# Patient Record
Sex: Female | Born: 1960 | Race: White | Hispanic: No | Marital: Single | Smoking: Former smoker
Health system: Southern US, Community
[De-identification: ages and names within clinical notes are randomized; demographics above are authoritative.]

## PROBLEM LIST (undated history)

## (undated) DIAGNOSIS — M1990 Unspecified osteoarthritis, unspecified site: Secondary | ICD-10-CM

## (undated) DIAGNOSIS — I739 Peripheral vascular disease, unspecified: Secondary | ICD-10-CM

## (undated) DIAGNOSIS — M79604 Pain in right leg: Secondary | ICD-10-CM

## (undated) DIAGNOSIS — M79605 Pain in left leg: Principal | ICD-10-CM

## (undated) DIAGNOSIS — I10 Essential (primary) hypertension: Secondary | ICD-10-CM

## (undated) DIAGNOSIS — I209 Angina pectoris, unspecified: Secondary | ICD-10-CM

## (undated) HISTORY — DX: Peripheral vascular disease, unspecified: I73.9

## (undated) HISTORY — PX: OTHER SURGICAL HISTORY: SHX169

## (undated) HISTORY — DX: Pain in left leg: M79.605

## (undated) HISTORY — DX: Pain in right leg: M79.604

---

## 2004-04-07 ENCOUNTER — Other Ambulatory Visit: Admission: RE | Admit: 2004-04-07 | Discharge: 2004-04-07 | Payer: Self-pay | Admitting: Obstetrics and Gynecology

## 2006-03-08 ENCOUNTER — Other Ambulatory Visit: Admission: RE | Admit: 2006-03-08 | Discharge: 2006-03-08 | Payer: Self-pay | Admitting: Nephrology

## 2006-04-04 ENCOUNTER — Encounter: Payer: Self-pay | Admitting: Vascular Surgery

## 2006-04-04 ENCOUNTER — Ambulatory Visit (HOSPITAL_COMMUNITY): Admission: RE | Admit: 2006-04-04 | Discharge: 2006-04-04 | Payer: Self-pay | Admitting: Nephrology

## 2006-06-19 HISTORY — PX: ARTHROSCOPY KNEE W/ DRILLING: SUR92

## 2006-06-25 ENCOUNTER — Encounter: Admission: RE | Admit: 2006-06-25 | Discharge: 2006-06-25 | Payer: Self-pay | Admitting: Nephrology

## 2006-07-31 ENCOUNTER — Inpatient Hospital Stay (HOSPITAL_COMMUNITY): Admission: AD | Admit: 2006-07-31 | Discharge: 2006-07-31 | Payer: Self-pay | Admitting: Obstetrics and Gynecology

## 2006-08-02 ENCOUNTER — Ambulatory Visit (HOSPITAL_COMMUNITY): Admission: RE | Admit: 2006-08-02 | Discharge: 2006-08-02 | Payer: Self-pay | Admitting: Family Medicine

## 2006-08-09 ENCOUNTER — Encounter: Admission: RE | Admit: 2006-08-09 | Discharge: 2006-09-25 | Payer: Self-pay | Admitting: Orthopedic Surgery

## 2006-08-15 ENCOUNTER — Encounter: Admission: RE | Admit: 2006-08-15 | Discharge: 2006-08-15 | Payer: Self-pay | Admitting: Rheumatology

## 2006-08-16 ENCOUNTER — Ambulatory Visit: Payer: Self-pay | Admitting: Obstetrics & Gynecology

## 2006-09-20 ENCOUNTER — Ambulatory Visit (HOSPITAL_COMMUNITY): Admission: RE | Admit: 2006-09-20 | Discharge: 2006-09-20 | Payer: Self-pay | Admitting: Obstetrics

## 2006-09-27 ENCOUNTER — Encounter: Admission: RE | Admit: 2006-09-27 | Discharge: 2006-11-09 | Payer: Self-pay | Admitting: Orthopedic Surgery

## 2006-09-28 ENCOUNTER — Encounter: Admission: RE | Admit: 2006-09-28 | Discharge: 2006-09-28 | Payer: Self-pay | Admitting: Obstetrics

## 2006-10-26 ENCOUNTER — Ambulatory Visit (HOSPITAL_COMMUNITY): Admission: RE | Admit: 2006-10-26 | Discharge: 2006-10-26 | Payer: Self-pay | Admitting: Obstetrics

## 2007-01-12 ENCOUNTER — Inpatient Hospital Stay (HOSPITAL_COMMUNITY): Admission: EM | Admit: 2007-01-12 | Discharge: 2007-01-13 | Payer: Self-pay | Admitting: Emergency Medicine

## 2007-01-12 ENCOUNTER — Encounter: Payer: Self-pay | Admitting: Gastroenterology

## 2007-01-12 DIAGNOSIS — K2990 Gastroduodenitis, unspecified, without bleeding: Secondary | ICD-10-CM

## 2007-01-12 DIAGNOSIS — K2970 Gastritis, unspecified, without bleeding: Secondary | ICD-10-CM | POA: Insufficient documentation

## 2007-01-13 ENCOUNTER — Encounter: Payer: Self-pay | Admitting: Gastroenterology

## 2007-01-13 DIAGNOSIS — K648 Other hemorrhoids: Secondary | ICD-10-CM | POA: Insufficient documentation

## 2007-01-16 ENCOUNTER — Ambulatory Visit: Payer: Self-pay | Admitting: Gastroenterology

## 2007-01-21 ENCOUNTER — Ambulatory Visit: Payer: Self-pay | Admitting: Gastroenterology

## 2007-01-30 ENCOUNTER — Ambulatory Visit: Payer: Self-pay | Admitting: Gastroenterology

## 2007-01-30 LAB — CONVERTED CEMR LAB
Basophils Absolute: 0 10*3/uL (ref 0.0–0.1)
Eosinophils Absolute: 0.1 10*3/uL (ref 0.0–0.6)
Eosinophils Relative: 0.7 % (ref 0.0–5.0)
HCT: 31.7 % — ABNORMAL LOW (ref 36.0–46.0)
Hemoglobin: 10.2 g/dL — ABNORMAL LOW (ref 12.0–15.0)
Lymphocytes Relative: 8.8 % — ABNORMAL LOW (ref 12.0–46.0)
Monocytes Absolute: 0.6 10*3/uL (ref 0.2–0.7)
Monocytes Relative: 5.2 % (ref 3.0–11.0)
Platelets: 460 10*3/uL — ABNORMAL HIGH (ref 150–400)
RDW: 19.3 % — ABNORMAL HIGH (ref 11.5–14.6)
WBC: 10.9 10*3/uL — ABNORMAL HIGH (ref 4.5–10.5)

## 2007-02-06 ENCOUNTER — Ambulatory Visit: Payer: Self-pay | Admitting: Gastroenterology

## 2007-02-06 LAB — CONVERTED CEMR LAB
Fecal Occult Blood: NEGATIVE
OCCULT 5: NEGATIVE

## 2007-02-12 ENCOUNTER — Ambulatory Visit (HOSPITAL_COMMUNITY): Admission: RE | Admit: 2007-02-12 | Discharge: 2007-02-12 | Payer: Self-pay | Admitting: Gastroenterology

## 2007-02-14 ENCOUNTER — Ambulatory Visit (HOSPITAL_COMMUNITY): Admission: RE | Admit: 2007-02-14 | Discharge: 2007-02-14 | Payer: Self-pay | Admitting: Gastroenterology

## 2007-02-14 DIAGNOSIS — D259 Leiomyoma of uterus, unspecified: Secondary | ICD-10-CM | POA: Insufficient documentation

## 2007-02-20 ENCOUNTER — Ambulatory Visit: Payer: Self-pay | Admitting: Gastroenterology

## 2007-02-27 ENCOUNTER — Ambulatory Visit (HOSPITAL_COMMUNITY): Admission: RE | Admit: 2007-02-27 | Discharge: 2007-02-27 | Payer: Self-pay | Admitting: Gastroenterology

## 2007-03-25 ENCOUNTER — Ambulatory Visit: Payer: Self-pay | Admitting: Gastroenterology

## 2007-03-25 LAB — CONVERTED CEMR LAB
Eosinophils Absolute: 0 10*3/uL (ref 0.0–0.6)
Eosinophils Relative: 0.3 % (ref 0.0–5.0)
Platelets: 531 10*3/uL — ABNORMAL HIGH (ref 150–400)
RBC: 4 M/uL (ref 3.87–5.11)

## 2007-03-27 ENCOUNTER — Ambulatory Visit: Payer: Self-pay | Admitting: Hematology and Oncology

## 2007-04-05 LAB — CBC & DIFF AND RETIC
BASO%: 0.3 % (ref 0.0–2.0)
EOS%: 0.1 % (ref 0.0–7.0)
HCT: 28.9 % — ABNORMAL LOW (ref 34.8–46.6)
HGB: 9.3 g/dL — ABNORMAL LOW (ref 11.6–15.9)
IRF: 0.33 (ref 0.130–0.330)
MCH: 22.7 pg — ABNORMAL LOW (ref 26.0–34.0)
MCHC: 32.4 g/dL (ref 32.0–36.0)
MONO#: 0.4 10*3/uL (ref 0.1–0.9)
RDW: 17.9 % — ABNORMAL HIGH (ref 11.3–14.5)
RETIC #: 101.1 10*3/uL (ref 19.7–115.1)
WBC: 9.1 10*3/uL (ref 3.9–10.0)
lymph#: 0.7 10*3/uL — ABNORMAL LOW (ref 0.9–3.3)

## 2007-04-05 LAB — URINALYSIS, MICROSCOPIC - CHCC
Bilirubin (Urine): NEGATIVE
Ketones: NEGATIVE mg/dL
Protein: 30 mg/dL
Specific Gravity, Urine: 1.03 (ref 1.003–1.035)
pH: 5 (ref 4.6–8.0)

## 2007-04-09 LAB — PROTEIN ELECTROPHORESIS, SERUM
Alpha-2-Globulin: 13.8 % — ABNORMAL HIGH (ref 7.1–11.8)
Beta 2: 6.6 % — ABNORMAL HIGH (ref 3.2–6.5)
Beta Globulin: 7.1 % (ref 4.7–7.2)
Gamma Globulin: 22 % — ABNORMAL HIGH (ref 11.1–18.8)
Total Protein, Serum Electrophoresis: 7.2 g/dL (ref 6.0–8.3)

## 2007-04-09 LAB — FOLATE: Folate: 16 ng/mL

## 2007-04-09 LAB — FERRITIN: Ferritin: 44 ng/mL (ref 10–291)

## 2007-04-09 LAB — COMPREHENSIVE METABOLIC PANEL
ALT: <8 U/L (ref 0–35)
AST: 8 U/L (ref 0–37)
Alkaline Phosphatase: 52 U/L (ref 39–117)
Creatinine, Ser: 0.5 mg/dL (ref 0.40–1.20)
Sodium: 140 mEq/L (ref 135–145)
Total Bilirubin: 0.4 mg/dL (ref 0.3–1.2)
Total Protein: 7.2 g/dL (ref 6.0–8.3)

## 2007-04-09 LAB — IRON AND TIBC: %SAT: 6 % — ABNORMAL LOW (ref 20–55)

## 2007-04-09 LAB — ERYTHROPOIETIN: Erythropoietin: 77 m[IU]/mL — ABNORMAL HIGH (ref 2.6–34.0)

## 2007-04-09 LAB — VITAMIN B12: Vitamin B-12: 403 pg/mL (ref 211–911)

## 2007-04-12 ENCOUNTER — Other Ambulatory Visit: Admission: RE | Admit: 2007-04-12 | Discharge: 2007-04-12 | Payer: Self-pay | Admitting: Hematology and Oncology

## 2007-04-12 ENCOUNTER — Encounter: Payer: Self-pay | Admitting: Hematology and Oncology

## 2007-04-15 ENCOUNTER — Emergency Department (HOSPITAL_COMMUNITY): Admission: EM | Admit: 2007-04-15 | Discharge: 2007-04-15 | Payer: Self-pay | Admitting: Emergency Medicine

## 2007-04-15 LAB — ENDOMYSIAL IGA ANTIBODY: Endomysial IgA Autoabs: <1:10 {titer}

## 2007-04-16 ENCOUNTER — Encounter (HOSPITAL_COMMUNITY): Admission: RE | Admit: 2007-04-16 | Discharge: 2007-06-19 | Payer: Self-pay | Admitting: Hematology and Oncology

## 2007-04-18 LAB — TYPE & CROSSMATCH - CHCC

## 2007-05-23 ENCOUNTER — Ambulatory Visit: Payer: Self-pay | Admitting: Hematology and Oncology

## 2007-05-23 LAB — CBC WITH DIFFERENTIAL/PLATELET
Basophils Absolute: 0 10*3/uL (ref 0.0–0.1)
Eosinophils Absolute: 0 10*3/uL (ref 0.0–0.5)
HGB: 9 g/dL — ABNORMAL LOW (ref 11.6–15.9)
MCV: 75.2 fL — ABNORMAL LOW (ref 81.0–101.0)
MONO%: 3.1 % (ref 0.0–13.0)
NEUT#: 5.8 10*3/uL (ref 1.5–6.5)
RBC: 3.78 10*6/uL (ref 3.70–5.32)
RDW: 14.6 % — ABNORMAL HIGH (ref 11.3–14.5)
WBC: 7.2 10*3/uL (ref 3.9–10.0)
lymph#: 1.2 10*3/uL (ref 0.9–3.3)

## 2007-05-23 LAB — BASIC METABOLIC PANEL
BUN: 13 mg/dL (ref 6–23)
Chloride: 102 mEq/L (ref 96–112)
Glucose, Bld: 142 mg/dL — ABNORMAL HIGH (ref 70–99)
Potassium: 3.5 mEq/L (ref 3.5–5.3)
Sodium: 136 mEq/L (ref 135–145)

## 2007-05-23 LAB — IRON AND TIBC
%SAT: 5 % — ABNORMAL LOW (ref 20–55)
Iron: 12 ug/dL — ABNORMAL LOW (ref 42–145)
UIBC: 210 ug/dL

## 2007-05-23 LAB — FERRITIN: Ferritin: 334 ng/mL — ABNORMAL HIGH (ref 10–291)

## 2007-06-05 LAB — CBC WITH DIFFERENTIAL/PLATELET
BASO%: 0.4 % (ref 0.0–2.0)
EOS%: 0.2 % (ref 0.0–7.0)
HGB: 9.3 g/dL — ABNORMAL LOW (ref 11.6–15.9)
MCH: 23.5 pg — ABNORMAL LOW (ref 26.0–34.0)
MCHC: 32.1 g/dL (ref 32.0–36.0)
MONO#: 0.5 10*3/uL (ref 0.1–0.9)
RDW: 18.2 % — ABNORMAL HIGH (ref 11.3–14.5)
WBC: 9.3 10*3/uL (ref 3.9–10.0)
lymph#: 0.8 10*3/uL — ABNORMAL LOW (ref 0.9–3.3)

## 2007-08-29 ENCOUNTER — Ambulatory Visit: Payer: Self-pay | Admitting: Hematology and Oncology

## 2007-08-29 DIAGNOSIS — D509 Iron deficiency anemia, unspecified: Secondary | ICD-10-CM

## 2007-08-29 DIAGNOSIS — Z8619 Personal history of other infectious and parasitic diseases: Secondary | ICD-10-CM

## 2007-08-29 DIAGNOSIS — M069 Rheumatoid arthritis, unspecified: Secondary | ICD-10-CM | POA: Insufficient documentation

## 2007-08-29 DIAGNOSIS — R195 Other fecal abnormalities: Secondary | ICD-10-CM

## 2007-08-29 DIAGNOSIS — N920 Excessive and frequent menstruation with regular cycle: Secondary | ICD-10-CM

## 2007-09-04 LAB — BASIC METABOLIC PANEL
BUN: 13 mg/dL (ref 6–23)
CO2: 23 mEq/L (ref 19–32)
Calcium: 8.4 mg/dL (ref 8.4–10.5)
Creatinine, Ser: 0.46 mg/dL (ref 0.40–1.20)
Glucose, Bld: 127 mg/dL — ABNORMAL HIGH (ref 70–99)
Sodium: 139 mEq/L (ref 135–145)

## 2007-09-04 LAB — CBC WITH DIFFERENTIAL/PLATELET
Eosinophils Absolute: 0 10*3/uL (ref 0.0–0.5)
HCT: 33.2 % — ABNORMAL LOW (ref 34.8–46.6)
LYMPH%: 8.7 % — ABNORMAL LOW (ref 14.0–48.0)
MCV: 74.7 fL — ABNORMAL LOW (ref 81.0–101.0)
MONO%: 4.4 % (ref 0.0–13.0)
NEUT#: 8 10*3/uL — ABNORMAL HIGH (ref 1.5–6.5)
NEUT%: 86.2 % — ABNORMAL HIGH (ref 39.6–76.8)
Platelets: 329 10*3/uL (ref 145–400)
RBC: 4.44 10*6/uL (ref 3.70–5.32)

## 2007-09-04 LAB — IRON AND TIBC
%SAT: 15 % — ABNORMAL LOW (ref 20–55)
Iron: 28 ug/dL — ABNORMAL LOW (ref 42–145)

## 2007-10-03 ENCOUNTER — Ambulatory Visit (HOSPITAL_COMMUNITY): Admission: RE | Admit: 2007-10-03 | Discharge: 2007-10-03 | Payer: Self-pay | Admitting: Nephrology

## 2007-11-28 ENCOUNTER — Ambulatory Visit: Payer: Self-pay | Admitting: Hematology and Oncology

## 2007-12-12 ENCOUNTER — Encounter: Payer: Self-pay | Admitting: Gastroenterology

## 2008-04-03 IMAGING — NM NM BOWEL IMG MECKELS
1 series · 6 of 6 positions shown · non-contrast
Comparison: None.

CLINICAL DATA: GI bleeding and anemia

NM BOWEL (MECKELS) SCAN
TECHNIQUE: Sequential abdominal images were obtained following intravenous
injection of radiopharmaceutical.
Radiopharmaceutical:  10.8 mCi 9c-RRm pertechnetate

[Series 1: dy flow · 4.75mm/px · 6 of 60 frames shown]
[frame 6/60]
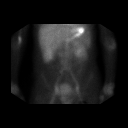
[frame 16/60]
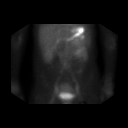
[frame 26/60]
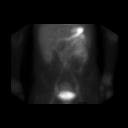
[frame 36/60]
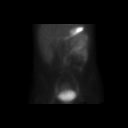
[frame 46/60]
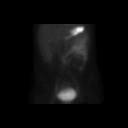
[frame 56/60]
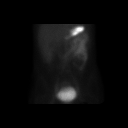

[6 of 6 positions shown; findings below may reference images not displayed]

FINDINGS: Imaging was carried [DATE] minutes. Normal activity of the gastric
mucosa is evident. There is no evidence for appearance or accumulation of
activity in the right lower quadrant or elsewhere in the lower abdomen and
anatomic pelvis to suggest ectopic gastric mucosa in a Meckel's diverticulum.

IMPRESSION

No scintigraphic evidence for a Meckel's diverticulum.

## 2008-05-11 ENCOUNTER — Encounter: Admission: RE | Admit: 2008-05-11 | Discharge: 2008-05-11 | Payer: Self-pay | Admitting: Nephrology

## 2008-05-15 ENCOUNTER — Ambulatory Visit: Payer: Self-pay | Admitting: Hematology and Oncology

## 2008-05-20 LAB — BASIC METABOLIC PANEL
BUN: 15 mg/dL (ref 6–23)
Potassium: 3.7 mEq/L (ref 3.5–5.3)

## 2008-05-20 LAB — CBC WITH DIFFERENTIAL/PLATELET
BASO%: 0.4 % (ref 0.0–2.0)
Basophils Absolute: 0 10*3/uL (ref 0.0–0.1)
EOS%: 0 % (ref 0.0–7.0)
MCH: 26.7 pg (ref 26.0–34.0)
MCHC: 33.2 g/dL (ref 32.0–36.0)
MCV: 80.6 fL — ABNORMAL LOW (ref 81.0–101.0)
MONO%: 7.1 % (ref 0.0–13.0)
RBC: 4.04 10*6/uL (ref 3.70–5.32)
RDW: 14.4 % (ref 11.3–14.5)

## 2008-05-20 LAB — FERRITIN: Ferritin: 59 ng/mL (ref 10–291)

## 2008-05-20 LAB — IRON AND TIBC
Iron: 13 ug/dL — ABNORMAL LOW (ref 42–145)
UIBC: 232 ug/dL

## 2008-05-21 ENCOUNTER — Encounter: Payer: Self-pay | Admitting: Gastroenterology

## 2008-10-22 ENCOUNTER — Ambulatory Visit (HOSPITAL_COMMUNITY): Admission: RE | Admit: 2008-10-22 | Discharge: 2008-10-22 | Payer: Self-pay | Admitting: Nephrology

## 2008-10-27 ENCOUNTER — Ambulatory Visit: Payer: Self-pay | Admitting: Hematology and Oncology

## 2008-12-07 ENCOUNTER — Emergency Department (HOSPITAL_COMMUNITY): Admission: EM | Admit: 2008-12-07 | Discharge: 2008-12-07 | Payer: Self-pay | Admitting: Emergency Medicine

## 2008-12-20 ENCOUNTER — Emergency Department (HOSPITAL_COMMUNITY): Admission: EM | Admit: 2008-12-20 | Discharge: 2008-12-20 | Payer: Self-pay | Admitting: Emergency Medicine

## 2009-10-26 ENCOUNTER — Ambulatory Visit (HOSPITAL_COMMUNITY): Admission: RE | Admit: 2009-10-26 | Discharge: 2009-10-26 | Payer: Self-pay | Admitting: Obstetrics

## 2010-07-10 ENCOUNTER — Encounter: Payer: Self-pay | Admitting: Obstetrics

## 2010-09-26 LAB — DIFFERENTIAL
Basophils Absolute: 0 10*3/uL (ref 0.0–0.1)
Basophils Relative: 0 % (ref 0–1)
Eosinophils Relative: 1 % (ref 0–5)
Lymphocytes Relative: 25 % (ref 12–46)
Monocytes Absolute: 0.5 10*3/uL (ref 0.1–1.0)
Monocytes Relative: 7 % (ref 3–12)
Neutro Abs: 4.9 10*3/uL (ref 1.7–7.7)

## 2010-09-26 LAB — CBC
Hemoglobin: 12.6 g/dL (ref 12.0–15.0)
MCV: 86.2 fL (ref 78.0–100.0)
RBC: 4.37 MIL/uL (ref 3.87–5.11)
RDW: 14.8 % (ref 11.5–15.5)

## 2010-10-27 ENCOUNTER — Other Ambulatory Visit: Payer: Self-pay | Admitting: Obstetrics

## 2010-10-27 DIAGNOSIS — Z1231 Encounter for screening mammogram for malignant neoplasm of breast: Secondary | ICD-10-CM

## 2010-11-01 NOTE — Assessment & Plan Note (Signed)
[facility]                         GASTROENTEROLOGY OFFICE NOTE   NAME:Anna Carlson, Anna Carlson                    MRN:          [id number]  DATE:03/25/2007                            DOB:          02/19/1961    Mrs. Biel returns for followup of iron-deficiency anemia and occult  gastrointestinal bleeding.  A recent small-bowel enteroscopy showed  erosive gastritis.  Colonoscopy in July 2008 showed only internal  hemorrhoids, and a capsule endoscopy study in August 2008 showed an area  of possible stenosis or spasm.  A subsequent CT enterography on February 14, 2007, showed no gastrointestinal lesions and a probable small  uterine fibroid.  Hemoccult-positive stool has been documented.  She  denies any aspirin and NSAID usage.  She remains on iron replacement.  CBC from today showed a hemoglobin of 9.2 and an MCV of 71.  She relates  no ongoing digestive complaints and specifically denies any melena or  hematochezia.   CURRENT MEDICATIONS:  1. Repleva 1 p.o. daily.  2. Omeprazole 20 mg daily.  3. Lasix 20 mg daily.  4. Metoprolol 50 mg twice a day.  5. Vicodin 5/500 3 times a day.  6. Plaquenil 200 mg b.i.d.  7. Prednisone 4.5 mg daily.   MEDICATION ALLERGIES:  None known.   PHYSICAL EXAMINATION:  GENERAL:  A thin white female in no acute  distress.  VITAL SIGNS:  Height 5 feet 2 inches, weight 111.6 pounds.  Blood  pressure 112/70, pulse 76 and regular.  CHEST:  Clear to auscultation bilaterally.  CARDIAC:  Regular rate and rhythm without murmurs.  ABDOMEN:  Soft and nontender with normoactive bowel sounds.   ASSESSMENT AND PLAN:  Iron-deficiency anemia. Occult gastrointestinal  bleeding of unclear etiology. Rheumatoid arthritis. She may have  gastrointestinal arteriovenous malformations that were not identified on  her recent studies. Although she has had no recent evidence of gross  gastrointestinal bleeding, her hemoglobin is not improving  rapidly on  iron supplements.  She may have a component of anemia of chronic disease  related to rheumatoid arthritis.  She may not absorb oral iron  adequately. Plan for hematology referral.     Venita Lick. Russella Dar, MD, [facility]  Electronically Signed   MTS/MedQ  DD: 03/26/2007  DT: 03/26/2007  Job #: [id number]   cc:   Jarome Matin, M.D.

## 2010-11-01 NOTE — Discharge Summary (Signed)
Anna Carlson, Anna Carlson             ACCOUNT NO.:  [id number]   MEDICAL RECORD NO.:  [id number]          PATIENT TYPE:  INP   LOCATION:  6730                         FACILITY:  [facility]   PHYSICIAN:  Madaline Savage, MD        DATE OF BIRTH:  08-28-1960   DATE OF ADMISSION:  01/11/2007  DATE OF DISCHARGE:  01/13/2007                               DISCHARGE SUMMARY   PRIMARY CARE PHYSICIAN:  This patient is unassigned to [place].   DISCHARGE DIAGNOSES:  1. Gastrointestinal bleed.  2. Chronic anemia.  3. Hypokalemia.  4. History of rheumatoid arthritis.   DISCHARGE MEDICATIONS:  1. Prednisone 4.5 mg daily.  2. Lasix 20 mg daily.  3. Metoprolol 50 mg twice daily.  4. Plaquenil 200 mg twice daily.  5. Prilosec 20 mg daily.  6. Ferrous sulfate 325 mg daily.   HISTORY OF PRESENT ILLNESS:  For a full history and physical, see the  history and physical dictated by Dr. Lovell Sheehan on January 12, 2007.  In  short, Ms. Craigo is a 50 year old lady with a history of rheumatoid  arthritis who was admitted with 4-5 days of melena and crampy abdominal  pain.  On admission, she was found that she has a +2 stool for occult  blood.  She was admitted and gastroenterology was consulted for further  evaluation.   PROCEDURES DONE IN THE HOSPITAL:  1. She had an upper endoscopy done by Dr. Russella Dar on January 13, 2007,      which showed erythematous mucosa and minimal erythema in the antrum      to pyloric sphincter.  No other abnormalities seen.  2. She had an colonoscopy done by Dr. Russella Dar on January 13, 2007, which      showed normal sigmoid colon to terminal ileum.   PROBLEM LIST:  1. GI bleed.  This is a 50 year old lady with rheumatoid arthritis who      has been on ibuprofen for the last few weeks, who comes in with 4-5      days of melena.  On admission, her hemoglobin is 8.9 which      decreased to 6.9 with hydration.  She was transfused 2 units and      since then her hemoglobin has been stable.  She was seen by  Dr.      Russella Dar from gastroenterology, who did an upper and lower endoscopy      but was not able to find a source of bleeding.  She has been      cleared by gastroenterology to be discharged home and they      recommended her to continue her on the Prilosec once a day.  The      gastroenterology doctor will set up a capsule endoscopy to complete      the workup.  2. Rheumatoid arthritis.  Her rheumatoid arthritis is under reasonable      control but she has been on prednisone and Plaquenil.  She is asked      to follow with her rheumatologist in this regard. She has been  advised to stay off the ibuprofen and Naprosyn and try to take      Tylenol for pain at this time.  3. Anemia.  She definitely has a component of anemia of chronic      disease.  Her iron studies show an elevated iron and elevated      percent saturation and elevated ferritin which is consistent with      anemia of chronic disease.  I suspect her baseline hemoglobin to be      approximately 10.  She obviously had iron-deficiency anemia on top      of it.  I will continue the iron as she was taking at home.   DISPOSITION:  She is being discharged home in a stable condition.   FOLLOWUP:  She is asked to follow up with her primary care doctor in 1-2  weeks.  She is also asked to follow up with Dr. Russella Dar, the  gastroenterologist, in one week.  She will call Dr. Ardell Isaacs office to  set up an appointment.      Madaline Savage, MD  Electronically Signed     PKN/MEDQ  D:  01/13/2007  T:  01/13/2007  Job:  [id number]

## 2010-11-04 NOTE — H&P (Signed)
Anna Carlson, Anna Carlson             ACCOUNT NO.:  [id number]   MEDICAL RECORD NO.:  [id number]          PATIENT TYPE:  INP   LOCATION:  6730                         FACILITY:  [facility]   PHYSICIAN:  Della Goo, M.D. DATE OF BIRTH:  10-23-1960   DATE OF ADMISSION:  01/11/2007  DATE OF DISCHARGE:  01/13/2007                              HISTORY & PHYSICAL   PRIMARY CARE PHYSICIAN:  Aundra Dubin, M.D.   CHIEF COMPLAINT:  Dark stools.   HISTORY OF PRESENT ILLNESS:  This is a 50 year old female with a history  of rheumatoid arthritis presenting to the emergency department after  passing out at home.  She reports passing dark, foul-smelling stools for  the past 4 days.  She also reports having abdominal cramping and nausea  but no vomiting.  The patient was evaluated in the emergency department  and found to have a hemoglobin level of 8.9 and was referred for  admission for GI bleeding/melena.  The patient also reports having  weakness and fatigue.  She reports having anemia and undergoing an  evaluation for anemia.   PAST MEDICAL HISTORY:  Significant for rheumatoid arthritis,  hypertension, and Lyme disease which she had in 2001 and she reports  undergoing several treatments for the Lyme disease.   PAST SURGICAL HISTORY:  Status post cholecystectomy.   MEDICATIONS:  1. Lopressor 50 mg one p.o. b.i.d.  2. Lasix 20 mg one p.o. daily.  3. Potassium 10 mEq one p.o. q.a.m.  4. Hydrocodone 5/500 one p.o. q.6 h. p.r.n.  5. Plaquenil 100 mg one p.o. b.i.d.  6. Prednisone 4.5 mg p.o. daily.   ALLERGIES:  NO KNOWN DRUG ALLERGIES.   SOCIAL HISTORY:  Patient is a nonsmoker, nondrinker   FAMILY HISTORY:  Positive for coronary artery disease in her father who  died in his 42s of an MI.  Positive for hypertension in her mother.  Positive for diabetes mellitus in her daughter and both of her parents.  Positive for cancer in her mother who had uterine cancer.   REVIEW OF SYSTEMS:   Pertinents are mentioned above.   PHYSICAL EXAMINATION FINDINGS:  GENERAL:  This is a 51 year old well-  nourished, well-developed female in no acute distress.  VITAL SIGNS:  Temperature 97.5, blood pressure 145/97 initially, heart  rate 99, respirations 18, O2 saturation 100%.  SKIN:  Examination shows positive pallor.  There is no icterus or  jaundice.  HEENT: Examination is normocephalic, atraumatic.  Pupils are equally  round, reactive to light.  Extraocular muscles are intact.  Funduscopic  benign.  Oropharynx is clear.  Neck is supple full range of motion.  No  thyromegaly, adenopathy or jugular venous distension.  CARDIOVASCULAR:  Regular rate and rhythm.  No murmurs, gallops or rubs.  LUNGS:  Clear to auscultation bilaterally.  ABDOMEN:  Positive bowel sounds, soft, nontender, nondistended.  EXTREMITIES:  Without cyanosis, clubbing or edema.  NEUROLOGIC EXAMINATION:  Alert and oriented times 3.  There are no focal  deficits.  VASCULAR:  Peripheral pulses are 2+ and intact.   LABORATORY STUDIES:  White blood cell count 7, hemoglobin 8.9,  hematocrit 28, MCV 66.3, platelets 615.  Sodium 137, potassium 3.1,  chloride 104, bicarb 23.9, BUN 10, creatinine 0.4, glucose 108.  Urinalysis - small bilirubin, otherwise negative.  Liver function tests  - albumin 2.9, AST 16, ALT 11, alkaline phosphatase 58, total bilirubin  0.3.  Urine HCG negative.   ASSESSMENT:  This is a 50 year old female being admitted with  1. An acute gastrointestinal bleed.  2. Microcytic anemia secondary to #1.  3. Hypokalemia.  4. Rheumatoid arthritis.  5. Hypertension.   PLAN:  The patient will be admitted to a telemetry area for cardiac  monitoring.  H and H checks will be performed q.6 h. times 48 hours.  She will be placed on a clear liquid diet for now.  IV Protonix therapy  has been ordered q.12 h.  DVT prophylaxis with TED hose has been  ordered.  She will continue her regular medications for now  and two  units of packed red blood cells have been placed on hold in case of a  need for transfusion.  Gastroenterology has been consulted and is to see  the patient.  I spoke with Dr. Russella Dar who is on for [facility]  gastroenterology.      Della Goo, M.D.  Electronically Signed     HJ/MEDQ  D:  01/12/2007  T:  01/13/2007  Job:  [id number]   cc:   Aundra Dubin, M.D.

## 2010-11-09 ENCOUNTER — Ambulatory Visit (HOSPITAL_COMMUNITY): Payer: Medicaid Other

## 2010-11-17 ENCOUNTER — Ambulatory Visit (HOSPITAL_COMMUNITY)
Admission: RE | Admit: 2010-11-17 | Discharge: 2010-11-17 | Disposition: A | Discharge status: Home / Self Care | Payer: Medicaid Other | Source: Ambulatory Visit | Attending: Obstetrics | Admitting: Obstetrics

## 2010-11-17 DIAGNOSIS — Z1231 Encounter for screening mammogram for malignant neoplasm of breast: Secondary | ICD-10-CM | POA: Insufficient documentation

## 2011-03-29 LAB — CROSSMATCH
ABO/RH(D): O POS
Antibody Screen: NEGATIVE

## 2011-03-29 LAB — CBC
HCT: 24 — ABNORMAL LOW
HCT: 25.1 — ABNORMAL LOW
Hemoglobin: 7.7 — CL
Hemoglobin: 7.9 — CL
MCHC: 31.5
MCV: 70.1 — ABNORMAL LOW
RBC: 3.39 — ABNORMAL LOW
RDW: 18.1 — ABNORMAL HIGH
WBC: 9.8

## 2011-03-29 LAB — I-STAT 8, (EC8 V) (CONVERTED LAB)
Acid-Base Excess: 1
BUN: 6
Bicarbonate: 25.6 — ABNORMAL HIGH
Chloride: 104
Glucose, Bld: 86
HCT: 27 — ABNORMAL LOW
Hemoglobin: 9.2 — ABNORMAL LOW
Operator id: 146091
Potassium: 3.6
Sodium: 139
TCO2: 27
pCO2, Ven: 41.9 — ABNORMAL LOW
pH, Ven: 7.395 — ABNORMAL HIGH

## 2011-03-29 LAB — DIFFERENTIAL
Basophils Absolute: 0.1
Basophils Absolute: 0.1
Lymphocytes Relative: 9 — ABNORMAL LOW
Lymphs Abs: 1.6
Monocytes Relative: 6
Neutro Abs: 3.5
Neutro Abs: 8.3 — ABNORMAL HIGH

## 2011-03-29 LAB — POCT I-STAT CREATININE
Creatinine, Ser: 0.6
Operator id: 146091

## 2011-03-29 LAB — TYPE AND SCREEN

## 2011-03-31 LAB — CBC
RBC: 3.64 — ABNORMAL LOW
WBC: 5.1

## 2011-03-31 LAB — SALICYLATE LEVEL: Salicylate Lvl: <4

## 2011-04-03 LAB — I-STAT 8, (EC8 V) (CONVERTED LAB)
BUN: 10
Bicarbonate: 23.9
Chloride: 104
Glucose, Bld: 108 — ABNORMAL HIGH
HCT: 33 — ABNORMAL LOW
Hemoglobin: 11.2 — ABNORMAL LOW
Potassium: 3.1 — ABNORMAL LOW
Sodium: 137

## 2011-04-03 LAB — CROSSMATCH: Antibody Screen: NEGATIVE

## 2011-04-03 LAB — CBC
HCT: 22.5 — ABNORMAL LOW
Hemoglobin: 6.9 — CL
MCV: 71.3 — ABNORMAL LOW
Platelets: 474 — ABNORMAL HIGH
RBC: 3.36 — ABNORMAL LOW
RBC: 4.23
RBC: 4.42
WBC: 4.8
WBC: 5.7
WBC: 7

## 2011-04-03 LAB — H. PYLORI ANTIBODY, IGG: H Pylori IgG: 0.7

## 2011-04-03 LAB — HEMOGLOBIN AND HEMATOCRIT, BLOOD
HCT: 29.4 — ABNORMAL LOW
HCT: 37.3
Hemoglobin: 11.6 — ABNORMAL LOW
Hemoglobin: 9.4 — ABNORMAL LOW

## 2011-04-03 LAB — BASIC METABOLIC PANEL
BUN: 4 — ABNORMAL LOW
Calcium: 8.1 — ABNORMAL LOW
Chloride: 108
Creatinine, Ser: 0.37 — ABNORMAL LOW
GFR calc Af Amer: >60
GFR calc Af Amer: >60
GFR calc non Af Amer: >60
GFR calc non Af Amer: >60
Potassium: 3.5
Sodium: 137

## 2011-04-03 LAB — PREGNANCY, URINE: Preg Test, Ur: NEGATIVE

## 2011-04-03 LAB — DIFFERENTIAL
Basophils Absolute: 0
Basophils Relative: 0
Eosinophils Absolute: 0
Eosinophils Relative: 0
Lymphocytes Relative: 9 — ABNORMAL LOW
Lymphs Abs: 0.4 — ABNORMAL LOW
Monocytes Absolute: 0 — ABNORMAL LOW
Monocytes Relative: 1 — ABNORMAL LOW
Neutro Abs: 4.3
Neutrophils Relative %: 90 — ABNORMAL HIGH

## 2011-04-03 LAB — URINALYSIS, ROUTINE W REFLEX MICROSCOPIC
Nitrite: NEGATIVE
Specific Gravity, Urine: 1.024
Urobilinogen, UA: 1

## 2011-04-03 LAB — SAMPLE TO BLOOD BANK

## 2011-04-03 LAB — PROTIME-INR: Prothrombin Time: 12.9

## 2011-04-03 LAB — APTT: aPTT: 30

## 2011-04-03 LAB — FOLATE RBC: RBC Folate: 1077 — ABNORMAL HIGH

## 2011-04-03 LAB — HEPATIC FUNCTION PANEL
ALT: 11
Albumin: 2.9 — ABNORMAL LOW
Alkaline Phosphatase: 58
Total Protein: 7.7

## 2011-04-03 LAB — VITAMIN B12: Vitamin B-12: 403 (ref 211–911)

## 2011-04-03 LAB — ABO/RH: ABO/RH(D): O POS

## 2011-04-03 LAB — IRON AND TIBC
Saturation Ratios: 67 — ABNORMAL HIGH
TIBC: 321

## 2011-08-04 ENCOUNTER — Ambulatory Visit
Admission: RE | Admit: 2011-08-04 | Discharge: 2011-08-04 | Disposition: A | Discharge status: Home / Self Care | Payer: Medicaid Other | Source: Ambulatory Visit | Attending: Nephrology | Admitting: Nephrology

## 2011-08-04 ENCOUNTER — Other Ambulatory Visit: Payer: Self-pay | Admitting: Nephrology

## 2011-08-04 DIAGNOSIS — R05 Cough: Secondary | ICD-10-CM

## 2011-09-16 ENCOUNTER — Observation Stay (HOSPITAL_COMMUNITY)
Admission: EM | Admit: 2011-09-16 | Discharge: 2011-09-17 | Disposition: A | Discharge status: Home / Self Care | Payer: Medicaid Other | Attending: Internal Medicine | Admitting: Internal Medicine

## 2011-09-16 ENCOUNTER — Other Ambulatory Visit: Payer: Self-pay

## 2011-09-16 ENCOUNTER — Encounter (HOSPITAL_COMMUNITY): Payer: Self-pay | Admitting: Emergency Medicine

## 2011-09-16 ENCOUNTER — Emergency Department (HOSPITAL_COMMUNITY): Payer: Medicaid Other

## 2011-09-16 DIAGNOSIS — D509 Iron deficiency anemia, unspecified: Secondary | ICD-10-CM | POA: Insufficient documentation

## 2011-09-16 DIAGNOSIS — K2990 Gastroduodenitis, unspecified, without bleeding: Secondary | ICD-10-CM | POA: Diagnosis present

## 2011-09-16 DIAGNOSIS — K2970 Gastritis, unspecified, without bleeding: Secondary | ICD-10-CM | POA: Insufficient documentation

## 2011-09-16 DIAGNOSIS — M069 Rheumatoid arthritis, unspecified: Secondary | ICD-10-CM | POA: Insufficient documentation

## 2011-09-16 DIAGNOSIS — R0789 Other chest pain: Principal | ICD-10-CM | POA: Insufficient documentation

## 2011-09-16 DIAGNOSIS — R079 Chest pain, unspecified: Secondary | ICD-10-CM | POA: Diagnosis present

## 2011-09-16 DIAGNOSIS — I10 Essential (primary) hypertension: Secondary | ICD-10-CM | POA: Insufficient documentation

## 2011-09-16 HISTORY — DX: Unspecified osteoarthritis, unspecified site: M19.90

## 2011-09-16 HISTORY — DX: Essential (primary) hypertension: I10

## 2011-09-16 HISTORY — DX: Angina pectoris, unspecified: I20.9

## 2011-09-16 LAB — DIFFERENTIAL
Basophils Relative: 1 % (ref 0–1)
Lymphocytes Relative: 22 % (ref 12–46)
Lymphs Abs: 1.2 10*3/uL (ref 0.7–4.0)
Monocytes Absolute: 0.5 10*3/uL (ref 0.1–1.0)
Monocytes Relative: 10 % (ref 3–12)
Neutro Abs: 3.6 10*3/uL (ref 1.7–7.7)
Neutrophils Relative %: 66 % (ref 43–77)

## 2011-09-16 LAB — CBC
HCT: 40.4 % (ref 36.0–46.0)
HCT: 39.1 % (ref 36.0–46.0)
Hemoglobin: 13.4 g/dL (ref 12.0–15.0)
MCHC: 33.2 g/dL (ref 30.0–36.0)
MCV: 87.3 fL (ref 78.0–100.0)
RBC: 4.62 MIL/uL (ref 3.87–5.11)
RBC: 4.48 MIL/uL (ref 3.87–5.11)
WBC: 5.4 10*3/uL (ref 4.0–10.5)
WBC: 7.7 10*3/uL (ref 4.0–10.5)

## 2011-09-16 LAB — PROTIME-INR: Prothrombin Time: 12 seconds (ref 11.6–15.2)

## 2011-09-16 LAB — COMPREHENSIVE METABOLIC PANEL
AST: 23 U/L (ref 0–37)
Albumin: 2.9 g/dL — ABNORMAL LOW (ref 3.5–5.2)
Alkaline Phosphatase: 70 U/L (ref 39–117)
BUN: 24 mg/dL — ABNORMAL HIGH (ref 6–23)
CO2: 25 mEq/L (ref 19–32)
Chloride: 107 mEq/L (ref 96–112)
Creatinine, Ser: 0.54 mg/dL (ref 0.50–1.10)
GFR calc non Af Amer: >90 mL/min (ref >90)
Potassium: 3.7 mEq/L (ref 3.5–5.1)
Total Bilirubin: 0.3 mg/dL (ref 0.3–1.2)

## 2011-09-16 LAB — CREATININE, SERUM
Creatinine, Ser: 0.6 mg/dL (ref 0.50–1.10)
GFR calc non Af Amer: >90 mL/min (ref >90)

## 2011-09-16 LAB — CK TOTAL AND CKMB (NOT AT ARMC): Total CK: 92 U/L (ref 7–177)

## 2011-09-16 LAB — CARDIAC PANEL(CRET KIN+CKTOT+MB+TROPI)
CK, MB: 1.9 ng/mL (ref 0.3–4.0)
Total CK: 69 U/L (ref 7–177)

## 2011-09-16 LAB — POCT I-STAT TROPONIN I

## 2011-09-16 MED ORDER — FUROSEMIDE 20 MG PO TABS
20.0000 mg | ORAL_TABLET | Freq: Every day | ORAL | Status: DC
  Administered 2011-09-16 – 2011-09-17 (×2): 20 mg via ORAL
  Filled 2011-09-16 (×2): qty 1

## 2011-09-16 MED ORDER — IOHEXOL 350 MG/ML SOLN
80.0000 mL | Freq: Once | INTRAVENOUS | Status: AC | PRN
  Administered 2011-09-16: 80 mL via INTRAVENOUS

## 2011-09-16 MED ORDER — NITROGLYCERIN 0.4 MG SL SUBL: 0.4000 mg | SUBLINGUAL_TABLET | SUBLINGUAL | Status: DC | PRN

## 2011-09-16 MED ORDER — HYDROCODONE-ACETAMINOPHEN 5-325 MG PO TABS: 1.0000 | ORAL_TABLET | ORAL | Status: DC | PRN

## 2011-09-16 MED ORDER — ADULT MULTIVITAMIN W/MINERALS CH
1.0000 | ORAL_TABLET | Freq: Every day | ORAL | Status: DC
Start: 2011-09-17 — End: 2011-09-17
  Administered 2011-09-17: 1 via ORAL
  Filled 2011-09-16: qty 1

## 2011-09-16 MED ORDER — SODIUM CHLORIDE 0.9 % IV SOLN: 250.0000 mL | INTRAVENOUS | Status: DC | PRN

## 2011-09-16 MED ORDER — ASPIRIN EC 81 MG PO TBEC
81.0000 mg | DELAYED_RELEASE_TABLET | Freq: Every day | ORAL | Status: DC
  Administered 2011-09-17: 81 mg via ORAL
  Filled 2011-09-16: qty 1

## 2011-09-16 MED ORDER — ENOXAPARIN SODIUM 40 MG/0.4ML ~~LOC~~ SOLN
40.0000 mg | SUBCUTANEOUS | Status: DC
  Administered 2011-09-16: 40 mg via SUBCUTANEOUS
  Filled 2011-09-16 (×2): qty 0.4

## 2011-09-16 MED ORDER — SULINDAC 200 MG PO TABS
200.0000 mg | ORAL_TABLET | Freq: Two times a day (BID) | ORAL | Status: DC
  Administered 2011-09-16 – 2011-09-17 (×2): 200 mg via ORAL
  Filled 2011-09-16 (×4): qty 1

## 2011-09-16 MED ORDER — METOPROLOL TARTRATE 50 MG PO TABS
50.0000 mg | ORAL_TABLET | Freq: Two times a day (BID) | ORAL | Status: DC
  Administered 2011-09-16 – 2011-09-17 (×2): 50 mg via ORAL
  Filled 2011-09-16 (×3): qty 1

## 2011-09-16 MED ORDER — SODIUM CHLORIDE 0.9 % IV SOLN
Freq: Once | INTRAVENOUS | Status: AC
  Administered 2011-09-16: 13:00:00 via INTRAVENOUS

## 2011-09-16 MED ORDER — ASPIRIN 81 MG PO CHEW
324.0000 mg | CHEWABLE_TABLET | Freq: Once | ORAL | Status: AC
  Administered 2011-09-16: 324 mg via ORAL
  Filled 2011-09-16: qty 4

## 2011-09-16 MED ORDER — SODIUM CHLORIDE 0.9 % IJ SOLN: 3.0000 mL | INTRAMUSCULAR | Status: DC | PRN

## 2011-09-16 MED ORDER — HYDROXYCHLOROQUINE SULFATE 200 MG PO TABS
200.0000 mg | ORAL_TABLET | Freq: Two times a day (BID) | ORAL | Status: DC
  Administered 2011-09-16 – 2011-09-17 (×2): 200 mg via ORAL
  Filled 2011-09-16 (×3): qty 1

## 2011-09-16 MED ORDER — PREDNISONE 5 MG PO TABS
5.0000 mg | ORAL_TABLET | Freq: Every day | ORAL | Status: DC
  Administered 2011-09-16 – 2011-09-17 (×2): 5 mg via ORAL
  Filled 2011-09-16 (×4): qty 1

## 2011-09-16 MED ORDER — SODIUM CHLORIDE 0.9 % IJ SOLN
3.0000 mL | Freq: Two times a day (BID) | INTRAMUSCULAR | Status: DC
  Administered 2011-09-16: 3 mL via INTRAVENOUS

## 2011-09-16 NOTE — ED Provider Notes (Signed)
History     CSN: [id number]  Arrival date & time 09/16/11  1149   First MD Initiated Contact with Patient 09/16/11 1158      Chief Complaint  Patient presents with  . Chest Pain    (Consider location/radiation/quality/duration/timing/severity/associated sxs/prior treatment) Patient is a 51 y.o. female presenting with chest pain. The history is provided by the patient.  Chest Pain    patient here with 14 hours of constant chest pain and pressure located at her left costal margin going to her epigastric area. Patient's symptoms have been recurring for the last 5 or 6 months. According to her, she had a chest x-ray yesterday which showed a mass over the left side of her heart and she has been referred for additional testing. She knows that symptoms seem to be worse with exertion and better with rest. Denies any fever chills or cough. No association with food. No associated diaphoresis or syncope. No leg pain or swelling. No medications taken for this prior to arrival.  Past Medical History  Diagnosis Date  . Arthritis   . Hypertension     History reviewed. No pertinent past surgical history.  No family history on file.  History  Substance Use Topics  . Smoking status: Former Games developer  . Smokeless tobacco: Not on file  . Alcohol Use: Yes    OB History    Grav Para Term Preterm Abortions TAB SAB Ect Mult Living                  Review of Systems  Cardiovascular: Positive for chest pain.  All other systems reviewed and are negative.    Allergies  Review of patient's allergies indicates no known allergies.  Home Medications   Current Outpatient Rx  Name Route Sig Dispense Refill  . DIAZEPAM 2 MG PO TABS Oral Take 2 mg by mouth every 8 (eight) hours as needed. For anxiety    . FERROUS SULFATE 325 (65 FE) MG PO TABS Oral Take 325 mg by mouth daily with breakfast.    . FUROSEMIDE 20 MG PO TABS Oral Take 20 mg by mouth daily.    Marland Kitchen HYDROXYCHLOROQUINE SULFATE 200 MG PO  TABS Oral Take 200 mg by mouth 2 (two) times daily.    Marland Kitchen METOPROLOL TARTRATE 100 MG PO TABS Oral Take 100 mg by mouth 2 (two) times daily.    . ADULT MULTIVITAMIN W/MINERALS CH Oral Take 1 tablet by mouth daily.    Marland Kitchen PREDNISONE 5 MG PO TABS Oral Take 7.5 mg by mouth daily.    . SULINDAC 200 MG PO TABS Oral Take 200 mg by mouth 2 (two) times daily.      BP 134/75  Pulse 68  Temp(Src) 98.3 F (36.8 C) (Oral)  Resp 20  SpO2 100%  Physical Exam  Nursing note and vitals reviewed. Constitutional: She is oriented to person, place, and time. She appears well-developed and well-nourished.  Non-toxic appearance. No distress.  HENT:  Head: Normocephalic and atraumatic.  Eyes: Conjunctivae, EOM and lids are normal. Pupils are equal, round, and reactive to light.  Neck: Normal range of motion. Neck supple. No tracheal deviation present. No mass present.  Cardiovascular: Normal rate, regular rhythm and normal heart sounds.  Exam reveals no gallop.   No murmur heard. Pulmonary/Chest: Effort normal and breath sounds normal. No stridor. No respiratory distress. She has no decreased breath sounds. She has no wheezes. She has no rhonchi. She has no rales.  Abdominal: Soft. Normal  appearance and bowel sounds are normal. She exhibits no distension. There is no tenderness. There is no rebound and no CVA tenderness.  Musculoskeletal: Normal range of motion. She exhibits no edema and no tenderness.  Neurological: She is alert and oriented to person, place, and time. She has normal strength. No cranial nerve deficit or sensory deficit. GCS eye subscore is 4. GCS verbal subscore is 5. GCS motor subscore is 6.  Skin: Skin is warm and dry. No abrasion and no rash noted.  Psychiatric: She has a normal mood and affect. Her speech is normal and behavior is normal.    ED Course  Procedures (including critical care time)   Labs Reviewed  CBC  DIFFERENTIAL  COMPREHENSIVE METABOLIC PANEL  PROTIME-INR  APTT    No results found.   No diagnosis found.    MDM   Date: 09/16/2011  Rate: 69  Rhythm: normal sinus rhythm  QRS Axis: normal  Intervals: normal  ST/T Wave abnormalities: nonspecific T wave changes  Conduction Disutrbances:none  Narrative Interpretation:   Old EKG Reviewed: unchanged    3:31 PM Patient had negative chest CT. Patient's history is atypical for ACS. she will be admitted  Toy Baker, MD 09/16/11 [phone]

## 2011-09-16 NOTE — ED Notes (Signed)
Pt. Stated, I started having some pain in my lt , below breat comes and goes.  And I had some pain in my left arm.   Now its just pressure.  Another thing that concerned me was I peed in my pants last night.

## 2011-09-16 NOTE — H&P (Signed)
PCP:  Jeri Cos  DOA:  09/16/2011 11:49 AM  Chief Complaint:   HPI: 51 y/o female with hx of HTN, Rheumatoid arthritis, GERD presented with constant left sided chest pain since 1 day. Patient was apparently asymptomatic when she had left sided sharp chest pain with some radiation to left  side while having dinner yesterday evening. Patient says it was 10/10 sharp associated with some SOB but without palpitations. The went went away after she took an aspirin at home. She went to bed but when awoke intermittently during the night she had chest pressure over the left side which was 5/10 in intensity, pressure like and non radiating.  Patient informs these symptoms were more with exertion and improved at rest. She denies any nausea, vomiting , abdominal pain, fever , chills, headache or blurry vision. Her pain has improved now . She denies any chest trauma or heavy weight lifting. Denies any weight loss , bowel or urinary symptoms. Patient was given ASA in the ED and initial EKG and troponin was unremarkable. hospitalist service called in to admit for chest pain observation.  Patient informs having similar symptoms in past few months. She informed having some abnormality in her lungs recently diagnosed and for which a CT angio was done to r/o PE and was negative. It showed epicardial fat pad.    Allergies: No Known Allergies  Prior to Admission medications   Medication Sig Start Date End Date Taking? Authorizing Provider  diazepam (VALIUM) 2 MG tablet Take 2 mg by mouth every 8 (eight) hours as needed. For anxiety   Yes Historical Provider, MD  ferrous sulfate 325 (65 FE) MG tablet Take 325 mg by mouth daily with breakfast.   Yes Historical Provider, MD  furosemide (LASIX) 20 MG tablet Take 20 mg by mouth daily.   Yes Historical Provider, MD  hydroxychloroquine (PLAQUENIL) 200 MG tablet Take 200 mg by mouth 2 (two) times daily.   Yes Historical Provider, MD  metoprolol (LOPRESSOR) 100 MG  tablet Take 100 mg by mouth 2 (two) times daily.   Yes Historical Provider, MD  Multiple Vitamin (MULITIVITAMIN WITH MINERALS) TABS Take 1 tablet by mouth daily.   Yes Historical Provider, MD  predniSONE (DELTASONE) 5 MG tablet Take 7.5 mg by mouth daily.   Yes Historical Provider, MD  sulindac (CLINORIL) 200 MG tablet Take 200 mg by mouth 2 (two) times daily.   Yes Historical Provider, MD    Past Medical History  Diagnosis Date  . Arthritis   . Hypertension     History reviewed. No pertinent past surgical history.  Social History:  reports that she has quit smoking. She does not have any smokeless tobacco history on file. She reports that she drinks alcohol. She reports that she does not use illicit drugs.  No family history on file.  Review of Systems:  Constitutional: Denies fever, chills, diaphoresis, appetite change and fatigue.  HEENT: Denies photophobia, eye pain, redness, hearing loss, ear pain, congestion, sore throat, rhinorrhea, sneezing, mouth sores, trouble swallowing, neck pain, neck stiffness and tinnitus.   Respiratory: associated SOB with chest pressure, denies  cough, ,  and wheezing.   Cardiovascular: left sided chest pain, palpitations and leg swelling.  Gastrointestinal: Denies nausea, vomiting, abdominal pain, diarrhea, constipation, blood in stool and abdominal distention.  Genitourinary: Denies dysuria, urgency, frequency, hematuria, flank pain and difficulty urinating.  Musculoskeletal: Denies myalgias, back pain, joint swelling, arthralgias and gait problem.  Skin: Denies pallor, rash and wound.  Neurological: Denies  dizziness, seizures, syncope, weakness, light-headedness, numbness and headaches.  Hematological: Denies adenopathy. Easy bruising, personal or family bleeding history  Psychiatric/Behavioral: Denies suicidal ideation, mood changes, confusion, nervousness, sleep disturbance and agitation   Physical Exam:  Filed Vitals:   09/16/11 1159  09/16/11 1242 09/16/11 1500 09/16/11 1545  BP: 134/75 126/75 120/77 126/75  Pulse: 68 64 74 78  Temp: 98.3 F (36.8 C)     TempSrc: Oral     Resp: 20 16 16 16   SpO2: 100%  100% 100%    Constitutional: Vital signs reviewed.  Patient is a well-developed and well-nourished in no acute distress and cooperative with exam. Alert and oriented x3.  Head: Normocephalic and atraumatic Ear: TM normal bilaterally Mouth: no erythema or exudates, MMM Eyes: PERRL, EOMI, conjunctivae normal, No scleral icterus.  Neck: Supple, Trachea midline normal ROM, No JVD, mass, thyromegaly, or carotid bruit present.  Cardiovascular: RRR, S1 normal, S2 normal, no MRG, pulses symmetric and intact bilaterally Pulmonary/Chest: CTAB, no wheezes, rales, or rhonchi Abdominal: Soft. Non-tender, non-distended, bowel sounds are normal, no masses, organomegaly, or guarding present.  GU: no CVA tenderness Musculoskeletal: No joint deformities, erythema, or stiffness, ROM full and no nontender Ext: no edema and no cyanosis, pulses palpable bilaterally (DP and PT) Hematology: no cervical, inginal, or axillary adenopathy.  Neurological: A&O x3, Strenght is normal and symmetric bilaterally, cranial nerve II-XII are grossly intact, no focal motor deficit, sensory intact to light touch bilaterally.  Skin: Warm, dry and intact. No rash, cyanosis, or clubbing.  Psychiatric: Normal mood and affect. speech and behavior is normal. Judgment and thought content normal. Cognition and memory are normal.   Labs on Admission:  Results for orders placed during the hospital encounter of 09/16/11 (from the past 48 hour(s))  CBC     Status: Abnormal   Collection Time   09/16/11 12:16 PM      Component Value Range Comment   WBC 5.4  4.0 - 10.5 (K/uL)    RBC 4.62  3.87 - 5.11 (MIL/uL)    Hemoglobin 13.4  12.0 - 15.0 (g/dL)    HCT 78.2  95.6 - 21.3 (%)    MCV 87.4  78.0 - 100.0 (fL)    MCH 29.0  26.0 - 34.0 (pg)    MCHC 33.2  30.0 - 36.0  (g/dL)    RDW 08.6  57.8 - 46.9 (%)    Platelets 142 (*) 150 - 400 (K/uL)   DIFFERENTIAL     Status: Normal   Collection Time   09/16/11 12:16 PM      Component Value Range Comment   Neutrophils Relative 66  43 - 77 (%)    Neutro Abs 3.6  1.7 - 7.7 (K/uL)    Lymphocytes Relative 22  12 - 46 (%)    Lymphs Abs 1.2  0.7 - 4.0 (K/uL)    Monocytes Relative 10  3 - 12 (%)    Monocytes Absolute 0.5  0.1 - 1.0 (K/uL)    Eosinophils Relative 1  0 - 5 (%)    Eosinophils Absolute 0.1  0.0 - 0.7 (K/uL)    Basophils Relative 1  0 - 1 (%)    Basophils Absolute 0.0  0.0 - 0.1 (K/uL)   COMPREHENSIVE METABOLIC PANEL     Status: Abnormal   Collection Time   09/16/11 12:16 PM      Component Value Range Comment   Sodium 141  135 - 145 (mEq/L)    Potassium 3.7  3.5 -  5.1 (mEq/L)    Chloride 107  96 - 112 (mEq/L)    CO2 25  19 - 32 (mEq/L)    Glucose, Bld 87  70 - 99 (mg/dL)    BUN 24 (*) 6 - 23 (mg/dL)    Creatinine, Ser 1.61  0.50 - 1.10 (mg/dL)    Calcium 8.5  8.4 - 10.5 (mg/dL)    Total Protein 5.7 (*) 6.0 - 8.3 (g/dL)    Albumin 2.9 (*) 3.5 - 5.2 (g/dL)    AST 23  0 - 37 (U/L)    ALT 18  0 - 35 (U/L)    Alkaline Phosphatase 70  39 - 117 (U/L)    Total Bilirubin 0.3  0.3 - 1.2 (mg/dL)    GFR calc non Af Amer >90  >90 (mL/min)    GFR calc Af Amer >90  >90 (mL/min)   PROTIME-INR     Status: Normal   Collection Time   09/16/11 12:16 PM      Component Value Range Comment   Prothrombin Time 12.0  11.6 - 15.2 (seconds)    INR 0.87  0.00 - 1.49    APTT     Status: Abnormal   Collection Time   09/16/11 12:16 PM      Component Value Range Comment   aPTT 23 (*) 24 - 37 (seconds)   POCT I-STAT TROPONIN I     Status: Normal   Collection Time   09/16/11 12:48 PM      Component Value Range Comment   Troponin i, poc 0.00  0.00 - 0.08 (ng/mL)    Comment 3              Radiological Exams on Admission: *RADIOLOGY REPORT*  Clinical Data: Chest pain.  CT ANGIOGRAPHY CHEST  Technique: Multidetector  CT imaging of the chest using the  standard protocol during bolus administration of intravenous  contrast. Multiplanar reconstructed images including MIPs were  obtained and reviewed to evaluate the vascular anatomy.  Contrast: 80mL OMNIPAQUE IOHEXOL 350 MG/ML IV SOLN  Comparison: No priors.  Findings:  Mediastinum: No filling defects within the pulmonary arterial tree  to suggest underlying pulmonary embolism. Heart size is normal.  There is no significant pericardial fluid, thickening or  pericardial calcification. No acute abnormality of the thoracic  aorta; specifically, no aneurysm or dissection. No pathologically  enlarged mediastinal or hilar lymph nodes. Esophagus is  unremarkable in appearance. Prominent epicardial fat pad adjacent  to the right atrium (this accounts for the perceived abnormality  noted on chest x-ray 08/04/2011).  Lungs/Pleura: No consolidative airspace disease. No pleural  effusions. No definite suspicious appearing pulmonary nodules or  masses are identified.  Upper Abdomen: Status post cholecystectomy.  Musculoskeletal: There are no aggressive appearing lytic or blastic  lesions noted in the visualized portions of the skeleton.  IMPRESSION:  1. No evidence of pulmonary embolism.  2. No acute findings in the thorax to account for the patient's  symptoms.    EKG: NSR, no ST-T changes  Assessment/Plan 51 y/o female with hx of HTN, Rheumatoid arthritis on low dose prednisone, gastritis presented with left sided chest pain.     *Chest pain Appears more atypical and musculoskeletal on exam. Will admit to observation tele R/o ACS with serial CE and EKG Cont ASA , /l nitro Prn vicodin  continue metoprolol  will check lipid panel and A1C in am If ruled out for ACS will need exercise stress test  To r/o ischemia. Can  be done as outpt if ruled out and chest pain free in am    Arthritis, rheumatoid Continue home meds   Gastritis Will order  PPI  Hypertension  continue home meds   DVT prophylaxis: sq lovenox  Diet: low Na  Full code   Time Spent on Admission: 50 minutes  Anna Carlson 09/16/2011, 4:33 PM

## 2011-09-17 LAB — LIPID PANEL
Cholesterol: 166 mg/dL (ref 0–200)
HDL: 60 mg/dL (ref >39)
Total CHOL/HDL Ratio: 2.8 RATIO
Triglycerides: 166 mg/dL — ABNORMAL HIGH (ref <150)
VLDL: 33 mg/dL (ref 0–40)

## 2011-09-17 LAB — CARDIAC PANEL(CRET KIN+CKTOT+MB+TROPI): Total CK: 76 U/L (ref 7–177)

## 2011-09-17 NOTE — Consult Note (Signed)
[facility]       CONSULTATION NOTE   Reason for Consult: Chest pain  Requesting Physician: Dr. Gonzella Lex  HPI: This is a 51 y.o. female with a past medical history significant for hypertension, GERD, and rheumatoid arthritis, who presented with one day of constant left-sided chest pain. She has been having episodes of chest discomfort which have varied from sharp pain to dull pressure over the past few months, however, they have been increasing in frequency.  This weekend, she reports her symptoms are sharp and radiates somewhat to her left arm. There is no significant associated shortness of breath or palpitations. Symptoms improved somewhat with aspirin. Workup in the emergency department was unremarkable including a negative CT angiogram and negative cardiac markers.  We are asked to evaluate for possible cardiac etiology for chest pain.  PMHx:  Past Medical History  Diagnosis Date  . Arthritis   . Hypertension   . Angina    History reviewed. No pertinent past surgical history.  FAMHx:  Father died of CAD in his 50's  SOCHx:  reports that she has quit smoking. She does not have any smokeless tobacco history on file. She reports that she drinks alcohol. She reports that she does not use illicit drugs.  ALLERGIES: No Known Allergies  ROS: A comprehensive review of systems was negative except for: Cardiovascular: positive for chest pain Musculoskeletal: positive for arthralgias, myalgias and stiff joints Neurological: positive for anxiety  HOME MEDICATIONS: Prescriptions prior to admission  Medication Sig Dispense Refill  . diazepam (VALIUM) 2 MG tablet Take 2 mg by mouth every 8 (eight) hours as needed. For anxiety      . ferrous sulfate 325 (65 FE) MG tablet Take 325 mg by mouth daily with breakfast.      . furosemide (LASIX) 20 MG tablet Take 20 mg by mouth daily.      . hydroxychloroquine (PLAQUENIL) 200 MG tablet Take 200 mg by mouth 2  (two) times daily.      . metoprolol (LOPRESSOR) 100 MG tablet Take 100 mg by mouth 2 (two) times daily.      . Multiple Vitamin (MULITIVITAMIN WITH MINERALS) TABS Take 1 tablet by mouth daily.      . predniSONE (DELTASONE) 5 MG tablet Take 7.5 mg by mouth daily.      . sulindac (CLINORIL) 200 MG tablet Take 200 mg by mouth 2 (two) times daily.        HOSPITAL MEDICATIONS: Prior to Admission:  Prescriptions prior to admission  Medication Sig Dispense Refill  . diazepam (VALIUM) 2 MG tablet Take 2 mg by mouth every 8 (eight) hours as needed. For anxiety      . ferrous sulfate 325 (65 FE) MG tablet Take 325 mg by mouth daily with breakfast.      . furosemide (LASIX) 20 MG tablet Take 20 mg by mouth daily.      . hydroxychloroquine (PLAQUENIL) 200 MG tablet Take 200 mg by mouth 2 (two) times daily.      . metoprolol (LOPRESSOR) 100 MG tablet Take 100 mg by mouth 2 (two) times daily.      . Multiple Vitamin (MULITIVITAMIN WITH MINERALS) TABS Take 1 tablet by mouth daily.      . predniSONE (DELTASONE) 5 MG tablet Take 7.5 mg by mouth daily.      . sulindac (CLINORIL) 200 MG tablet Take 200 mg by mouth 2 (two) times daily.  VITALS: Blood pressure 116/66, pulse 63, temperature 97.7 F (36.5 C), temperature source Oral, resp. rate 16, SpO2 100.00%.  PHYSICAL EXAM: General appearance: alert and no distress Neck: no adenopathy, no carotid bruit, no JVD, supple, symmetrical, trachea midline and thyroid not enlarged, symmetric, no tenderness/mass/nodules Lungs: clear to auscultation bilaterally Heart: regular rate and rhythm, S1, S2 normal, no murmur, click, rub or gallop Abdomen: soft, non-tender; bowel sounds normal; no masses,  no organomegaly Extremities: extremities normal, atraumatic, no cyanosis or edema Pulses: 2+ and symmetric Skin: Skin color, texture, turgor normal. No rashes or lesions Neurologic: Grossly normal  LABS: Results for orders placed during the hospital encounter  of 09/16/11 (from the past 48 hour(s))  CBC     Status: Abnormal   Collection Time   09/16/11 12:16 PM      Component Value Range Comment   WBC 5.4  4.0 - 10.5 (K/uL)    RBC 4.62  3.87 - 5.11 (MIL/uL)    Hemoglobin 13.4  12.0 - 15.0 (g/dL)    HCT 14.7  82.9 - 56.2 (%)    MCV 87.4  78.0 - 100.0 (fL)    MCH 29.0  26.0 - 34.0 (pg)    MCHC 33.2  30.0 - 36.0 (g/dL)    RDW 13.0  86.5 - 78.4 (%)    Platelets 142 (*) 150 - 400 (K/uL)   DIFFERENTIAL     Status: Normal   Collection Time   09/16/11 12:16 PM      Component Value Range Comment   Neutrophils Relative 66  43 - 77 (%)    Neutro Abs 3.6  1.7 - 7.7 (K/uL)    Lymphocytes Relative 22  12 - 46 (%)    Lymphs Abs 1.2  0.7 - 4.0 (K/uL)    Monocytes Relative 10  3 - 12 (%)    Monocytes Absolute 0.5  0.1 - 1.0 (K/uL)    Eosinophils Relative 1  0 - 5 (%)    Eosinophils Absolute 0.1  0.0 - 0.7 (K/uL)    Basophils Relative 1  0 - 1 (%)    Basophils Absolute 0.0  0.0 - 0.1 (K/uL)   COMPREHENSIVE METABOLIC PANEL     Status: Abnormal   Collection Time   09/16/11 12:16 PM      Component Value Range Comment   Sodium 141  135 - 145 (mEq/L)    Potassium 3.7  3.5 - 5.1 (mEq/L)    Chloride 107  96 - 112 (mEq/L)    CO2 25  19 - 32 (mEq/L)    Glucose, Bld 87  70 - 99 (mg/dL)    BUN 24 (*) 6 - 23 (mg/dL)    Creatinine, Ser 6.96  0.50 - 1.10 (mg/dL)    Calcium 8.5  8.4 - 10.5 (mg/dL)    Total Protein 5.7 (*) 6.0 - 8.3 (g/dL)    Albumin 2.9 (*) 3.5 - 5.2 (g/dL)    AST 23  0 - 37 (U/L)    ALT 18  0 - 35 (U/L)    Alkaline Phosphatase 70  39 - 117 (U/L)    Total Bilirubin 0.3  0.3 - 1.2 (mg/dL)    GFR calc non Af Amer >90  >90 (mL/min)    GFR calc Af Amer >90  >90 (mL/min)   PROTIME-INR     Status: Normal   Collection Time   09/16/11 12:16 PM      Component Value Range Comment   Prothrombin Time 12.0  11.6 - 15.2 (seconds)    INR 0.87  0.00 - 1.49    APTT     Status: Abnormal   Collection Time   09/16/11 12:16 PM      Component Value Range  Comment   aPTT 23 (*) 24 - 37 (seconds)   POCT I-STAT TROPONIN I     Status: Normal   Collection Time   09/16/11 12:48 PM      Component Value Range Comment   Troponin i, poc 0.00  0.00 - 0.08 (ng/mL)    Comment 3            CK TOTAL AND CKMB     Status: Normal   Collection Time   09/16/11  3:58 PM      Component Value Range Comment   Total CK 92  7 - 177 (U/L) HEMOLYZED SPECIMEN, RESULTS MAY BE AFFECTED   CK, MB 1.9  0.3 - 4.0 (ng/mL)    Relative Index RELATIVE INDEX IS INVALID  0.0 - 2.5    TSH     Status: Normal   Collection Time   09/16/11  4:56 PM      Component Value Range Comment   TSH 1.339  0.350 - 4.500 (uIU/mL)   CREATININE, SERUM     Status: Normal   Collection Time   09/16/11  4:56 PM      Component Value Range Comment   Creatinine, Ser 0.60  0.50 - 1.10 (mg/dL)    GFR calc non Af Amer >90  >90 (mL/min)    GFR calc Af Amer >90  >90 (mL/min)   CARDIAC PANEL(CRET KIN+CKTOT+MB+TROPI)     Status: Normal   Collection Time   09/16/11  9:38 PM      Component Value Range Comment   Total CK 69  7 - 177 (U/L)    CK, MB 1.9  0.3 - 4.0 (ng/mL)    Troponin I <0.30  <0.30 (ng/mL)    Relative Index RELATIVE INDEX IS INVALID  0.0 - 2.5    CBC     Status: Abnormal   Collection Time   09/16/11  9:38 PM      Component Value Range Comment   WBC 7.7  4.0 - 10.5 (K/uL)    RBC 4.48  3.87 - 5.11 (MIL/uL)    Hemoglobin 13.1  12.0 - 15.0 (g/dL)    HCT 16.1  09.6 - 04.5 (%)    MCV 87.3  78.0 - 100.0 (fL)    MCH 29.2  26.0 - 34.0 (pg)    MCHC 33.5  30.0 - 36.0 (g/dL)    RDW 40.9  81.1 - 91.4 (%)    Platelets 131 (*) 150 - 400 (K/uL)   CARDIAC PANEL(CRET KIN+CKTOT+MB+TROPI)     Status: Normal   Collection Time   09/17/11  5:07 AM      Component Value Range Comment   Total CK 76  7 - 177 (U/L)    CK, MB 1.8  0.3 - 4.0 (ng/mL)    Troponin I <0.30  <0.30 (ng/mL)    Relative Index RELATIVE INDEX IS INVALID  0.0 - 2.5    LIPID PANEL     Status: Abnormal   Collection Time   09/17/11  5:07  AM      Component Value Range Comment   Cholesterol 166  0 - 200 (mg/dL)    Triglycerides 782 (*) <150 (mg/dL)    HDL 60  >95 (mg/dL)    Total  CHOL/HDL Ratio 2.8      VLDL 33  0 - 40 (mg/dL)    LDL Cholesterol 73  0 - 99 (mg/dL)     IMAGING: Ct Angio Chest W/cm &/or Wo Cm  09/16/2011  *RADIOLOGY REPORT*  Clinical Data: Chest pain.  CT ANGIOGRAPHY CHEST  Technique:  Multidetector CT imaging of the chest using the standard protocol during bolus administration of intravenous contrast. Multiplanar reconstructed images including MIPs were obtained and reviewed to evaluate the vascular anatomy.  Contrast: 80mL OMNIPAQUE IOHEXOL 350 MG/ML IV SOLN  Comparison: No priors.  Findings:  Mediastinum: No filling defects within the pulmonary arterial tree to suggest underlying pulmonary embolism. Heart size is normal. There is no significant pericardial fluid, thickening or pericardial calcification. No acute abnormality of the thoracic aorta; specifically, no aneurysm or dissection. No pathologically enlarged mediastinal or hilar lymph nodes. Esophagus is unremarkable in appearance.   Prominent epicardial fat pad adjacent to the right atrium (this accounts for the perceived abnormality noted on chest x-ray 08/04/2011).  Lungs/Pleura: No consolidative airspace disease.  No pleural effusions.  No definite suspicious appearing pulmonary nodules or masses are identified.  Upper Abdomen: Status post cholecystectomy.  Musculoskeletal: There are no aggressive appearing lytic or blastic lesions noted in the visualized portions of the skeleton.  IMPRESSION: 1.  No evidence of pulmonary embolism. 2.  No acute findings in the thorax to account for the patient's symptoms.  Original Report Authenticated By: Florencia Reasons, M.D.    IMPRESSION: 1. Atypical chest pain 2. Hypertension - controlled 3. Rheumatoid arthritis  RECOMMENDATION: 1. Symptoms are somewhat atypical for cardiac chest pain, however, RA is a  significant risk factor for CAD, similar to diabetes. Symptoms seem to be occuring more frequently. I would recommend an outpatient nuclear stress test in our office. She cannot exercise enough for treadmill testing, therefore she will likely have a lexiscan. I will see her in follow-up in our office. She is likely okay for discharge today.  The office will contact her in the morning to schedule her test.  Time Spent Directly with Patient: 30 minutes  Chrystie Nose, MD, [facility] Attending Cardiologist [facility]  Suhailah Kwan C 09/17/2011, 9:53 AM

## 2011-09-17 NOTE — Discharge Summary (Addendum)
Patient ID: Anna Carlson MRN: [id number] DOB/AGE: 1961-03-04 51 y.o.  Admit date: 09/16/2011 Discharge date: 09/17/2011  Primary Care Physician:  Jeri Cos, MD, MD  Discharge Diagnoses:     Principal Problem:  *Chest pain, atypical likely musculoskeletal   Active Problems:  ARTHRITIS, RHEUMATOID  GASTRITIS  ANEMIA, IRON DEFICIENCY  HYPERTENSION   Medication List  As of 09/17/2011 10:41 AM   STOP taking these medications         diazepam 2 MG tablet         TAKE these medications         ferrous sulfate 325 (65 FE) MG tablet   Take 325 mg by mouth daily with breakfast.      furosemide 20 MG tablet   Commonly known as: LASIX   Take 20 mg by mouth daily.      hydroxychloroquine 200 MG tablet   Commonly known as: PLAQUENIL   Take 200 mg by mouth 2 (two) times daily.      metoprolol 100 MG tablet   Commonly known as: LOPRESSOR   Take 100 mg by mouth 2 (two) times daily.      mulitivitamin with minerals Tabs   Take 1 tablet by mouth daily.      predniSONE 5 MG tablet   Commonly known as: DELTASONE   Take 7.5 mg by mouth daily.      sulindac 200 MG tablet   Commonly known as: CLINORIL   Take 200 mg by mouth 2 (two) times daily.            Disposition and Follow-up:  Home with outpt follow up with PCP and [facility] for lexiscan  Consults:   [place] ( [facility])  Significant Diagnostic Studies:  No results found.  Brief H and P: For complete details please refer to admission H and P, but in brief 51 y/o female with hx of HTN, Rheumatoid arthritis, GERD presented with constant left sided chest pain since 1 day. Patient was apparently asymptomatic when she had left sided sharp chest pain with some radiation to left side while having dinner yesterday evening. Patient says it was 10/10 sharp associated with some SOB but without palpitations. The went went away after she took an aspirin at home. She went to bed but when awoke intermittently during the night  she had chest pressure over the left side which was 5/10 in intensity, pressure like and non radiating. Patient informs these symptoms were more with exertion and improved at rest. She denies any nausea, vomiting , abdominal pain, fever , chills, headache or blurry vision. Her pain has improved now . She denies any chest trauma or heavy weight lifting. Denies any weight loss , bowel or urinary symptoms. Patient was given ASA in the ED and initial EKG and troponin was unremarkable. hospitalist service called in to admit for chest pain observation.  Patient informs having similar symptoms in past few months. She informed having some abnormality in her lungs recently diagnosed and for which a CT angio was done to r/o PE and was negative. It showed epicardial fat pad.    Physical Exam on Discharge:  Filed Vitals:   09/16/11 1708 09/16/11 2100 09/17/11 0500 09/17/11 1032  BP: 119/68 125/69 116/66 115/79  Pulse: 73 67 63 64  Temp: 98 F (36.7 C) 97.6 F (36.4 C) 97.7 F (36.5 C)   TempSrc:      Resp: 18 16 16    SpO2: 100% 100% 100%  Intake/Output Summary (Last 24 hours) at 09/17/11 1041 Last data filed at 09/16/11 2100  Gross per 24 hour  Intake    240 ml  Output      0 ml  Net    240 ml    General: Alert, awake, oriented x3, in no acute distress. HEENT: No bruits, no goiter. Heart: Regular rate and rhythm, without murmurs, rubs, gallops. ( pain was reproducible on exam on admission) Lungs: Clear to auscultation bilaterally. Abdomen: Soft, nontender, nondistended, positive bowel sounds. Extremities: No clubbing cyanosis or edema with positive pedal pulses. Neuro: Grossly intact, nonfocal.  CBC:    Component Value Date/Time   WBC 7.7 09/16/2011 2138   WBC 6.9 05/20/2008 1506   HGB 13.1 09/16/2011 2138   HGB 10.8* 05/20/2008 1506   HCT 39.1 09/16/2011 2138   HCT 32.6* 05/20/2008 1506   PLT 131* 09/16/2011 2138   PLT 370 05/20/2008 1506   MCV 87.3 09/16/2011 2138   MCV 80.6*  05/20/2008 1506   NEUTROABS 3.6 09/16/2011 1216   NEUTROABS 5.4 05/20/2008 1506   LYMPHSABS 1.2 09/16/2011 1216   LYMPHSABS 1.0 05/20/2008 1506   MONOABS 0.5 09/16/2011 1216   MONOABS 0.5 05/20/2008 1506   EOSABS 0.1 09/16/2011 1216   EOSABS 0.0 05/20/2008 1506   BASOSABS 0.0 09/16/2011 1216   BASOSABS 0.0 05/20/2008 1506    Basic Metabolic Panel:    Component Value Date/Time   NA 141 09/16/2011 1216   K 3.7 09/16/2011 1216   CL 107 09/16/2011 1216   CO2 25 09/16/2011 1216   BUN 24* 09/16/2011 1216   CREATININE 0.60 09/16/2011 1656   GLUCOSE 87 09/16/2011 1216   CALCIUM 8.5 09/16/2011 1216    Hospital Course:  *Chest pain  Appeared to be  more atypical and musculoskeletal on exam.  Will admitted to observation tele  Patient ruled out for  ACS with serial CE and EKG  Given prn vicodin for improvement in pain continue metoprolol  Lipid panel wnl  and A1C  pending  Patient seen by Dr Rennis Golden and agrees pain to be more atypical however given her age and co morbidities including rheumatoid arthritis she is a CAD risk and plans on lexiscan as outpatient. His office will call patient with appt. Patient remains chest pain free overnight and stable on tele. She can be discharged home.   Arthritis, rheumatoid  Continue home meds  She is on low dose prednisone and chloroquine and NSAIDs  For years from her PCP. i strongly recommended her to see a rheumatologist.   Patient clinically stable for discharge home with outpt follow up     Time spent on Discharge: 30 minutes  Signed: Eddie North 09/17/2011, 10:41 AM

## 2011-09-17 NOTE — Progress Notes (Signed)
I agree with the assessment and medication administration done by Shary Key student.

## 2011-12-06 ENCOUNTER — Other Ambulatory Visit (HOSPITAL_COMMUNITY): Payer: Self-pay | Admitting: Nephrology

## 2011-12-06 DIAGNOSIS — Z1231 Encounter for screening mammogram for malignant neoplasm of breast: Secondary | ICD-10-CM

## 2012-01-02 ENCOUNTER — Ambulatory Visit (HOSPITAL_COMMUNITY)
Admission: RE | Admit: 2012-01-02 | Discharge: 2012-01-02 | Disposition: A | Discharge status: Home / Self Care | Payer: Medicaid Other | Source: Ambulatory Visit | Attending: Nephrology | Admitting: Nephrology

## 2012-01-02 DIAGNOSIS — Z1231 Encounter for screening mammogram for malignant neoplasm of breast: Secondary | ICD-10-CM | POA: Insufficient documentation

## 2012-02-02 ENCOUNTER — Other Ambulatory Visit: Payer: Self-pay | Admitting: Nephrology

## 2012-02-06 ENCOUNTER — Other Ambulatory Visit: Payer: Self-pay | Admitting: Nephrology

## 2012-03-09 ENCOUNTER — Other Ambulatory Visit (HOSPITAL_COMMUNITY): Payer: Self-pay | Admitting: Nephrology

## 2012-09-25 ENCOUNTER — Ambulatory Visit: Payer: Self-pay | Admitting: Obstetrics

## 2012-10-24 ENCOUNTER — Ambulatory Visit: Payer: Self-pay | Admitting: Obstetrics

## 2012-11-07 ENCOUNTER — Ambulatory Visit (INDEPENDENT_AMBULATORY_CARE_PROVIDER_SITE_OTHER): Payer: Medicaid Other | Admitting: Obstetrics

## 2012-11-07 ENCOUNTER — Encounter: Payer: Self-pay | Admitting: Obstetrics

## 2012-11-07 VITALS — BP 129/85 | HR 73 | Temp 98.3°F | Ht 63.0 in | Wt 140.0 lb

## 2012-11-07 DIAGNOSIS — N950 Postmenopausal bleeding: Secondary | ICD-10-CM

## 2012-11-07 DIAGNOSIS — Z780 Asymptomatic menopausal state: Secondary | ICD-10-CM

## 2012-11-07 DIAGNOSIS — Z113 Encounter for screening for infections with a predominantly sexual mode of transmission: Secondary | ICD-10-CM

## 2012-11-07 DIAGNOSIS — N949 Unspecified condition associated with female genital organs and menstrual cycle: Secondary | ICD-10-CM

## 2012-11-07 DIAGNOSIS — Z0000 Encounter for general adult medical examination without abnormal findings: Secondary | ICD-10-CM

## 2012-11-07 LAB — COMPREHENSIVE METABOLIC PANEL
Alkaline Phosphatase: 67 U/L (ref 39–117)
BUN: 20 mg/dL (ref 6–23)
Creat: 0.68 mg/dL (ref 0.50–1.10)
Glucose, Bld: 90 mg/dL (ref 70–99)
Sodium: 141 mEq/L (ref 135–145)
Total Bilirubin: 0.4 mg/dL (ref 0.3–1.2)
Total Protein: 6.8 g/dL (ref 6.0–8.3)

## 2012-11-07 LAB — LIPID PANEL
Cholesterol: 200 mg/dL (ref 0–200)
HDL: 61 mg/dL (ref >39)
Triglycerides: 256 mg/dL — ABNORMAL HIGH (ref <150)

## 2012-11-07 LAB — TSH: TSH: 1.498 u[IU]/mL (ref 0.350–4.500)

## 2012-11-07 NOTE — Progress Notes (Signed)
.   Subjective:     Anna Carlson is a 52 y.o. female here for a routine exam.  Current complaints: pain during intercourse.  Vaginal discharge with an odor.  She would like all STD testing including blood work.  Personal health questionnaire reviewed: yes.   Gynecologic History Patient's last menstrual period was 10/24/2012. Contraception: none Last Pap: 08/2009. Results were: normal Last mammogram: 10/2010 . Results were: normal  Obstetric History OB History   Grav Para Term Preterm Abortions TAB SAB Ect Mult Living                   The following portions of the patient's history were reviewed and updated as appropriate: allergies, current medications, past family history, past medical history, past social history, past surgical history and problem list.  Review of Systems Pertinent items are noted in HPI.    Objective:    General appearance: alert and no distress Breasts: normal appearance, no masses or tenderness Abdomen: normal findings: soft, non-tender Pelvic: cervix normal in appearance, external genitalia normal, no cervical motion tenderness, uterus normal size, shape, and consistency, vagina normal without discharge and bilateral adnexal tenderness, no masses.    Assessment:    Healthy female exam.   Onset of period after 1 year of amenorrhea.  Pelvic pain   Plan:      Ultrasound scheduled.    F/U 2 weeks.  Endometrial biopsy.

## 2012-11-08 ENCOUNTER — Encounter: Payer: Self-pay | Admitting: Obstetrics

## 2012-11-08 DIAGNOSIS — N949 Unspecified condition associated with female genital organs and menstrual cycle: Secondary | ICD-10-CM | POA: Insufficient documentation

## 2012-11-08 DIAGNOSIS — N950 Postmenopausal bleeding: Secondary | ICD-10-CM | POA: Insufficient documentation

## 2012-11-08 DIAGNOSIS — Z780 Asymptomatic menopausal state: Secondary | ICD-10-CM | POA: Insufficient documentation

## 2012-11-08 LAB — CBC
MCHC: 33.6 g/dL (ref 30.0–36.0)
Platelets: 209 10*3/uL (ref 150–400)
RDW: 15 % (ref 11.5–15.5)
WBC: 7 10*3/uL (ref 4.0–10.5)

## 2012-11-08 LAB — PAP IG W/ RFLX HPV ASCU

## 2012-11-08 LAB — RPR

## 2012-11-08 LAB — HEPATITIS C ANTIBODY: HCV Ab: NEGATIVE

## 2012-11-08 LAB — HEPATITIS B SURFACE ANTIGEN: Hepatitis B Surface Ag: NEGATIVE

## 2012-11-08 LAB — HIV ANTIBODY (ROUTINE TESTING W REFLEX): HIV: NONREACTIVE

## 2012-11-13 ENCOUNTER — Other Ambulatory Visit: Payer: Medicaid Other | Admitting: *Deleted

## 2012-11-13 DIAGNOSIS — E785 Hyperlipidemia, unspecified: Secondary | ICD-10-CM

## 2012-11-13 LAB — LIPID PANEL
Cholesterol: 181 mg/dL (ref 0–200)
Total CHOL/HDL Ratio: 3.2 Ratio

## 2012-11-21 ENCOUNTER — Ambulatory Visit (HOSPITAL_COMMUNITY)
Admission: RE | Admit: 2012-11-21 | Discharge: 2012-11-21 | Disposition: A | Discharge status: Home / Self Care | Payer: Medicaid Other | Source: Ambulatory Visit | Attending: Obstetrics | Admitting: Obstetrics

## 2012-11-21 DIAGNOSIS — Z780 Asymptomatic menopausal state: Secondary | ICD-10-CM | POA: Insufficient documentation

## 2012-11-21 DIAGNOSIS — N949 Unspecified condition associated with female genital organs and menstrual cycle: Secondary | ICD-10-CM

## 2012-11-21 DIAGNOSIS — Z13820 Encounter for screening for osteoporosis: Secondary | ICD-10-CM | POA: Insufficient documentation

## 2012-11-21 DIAGNOSIS — N83209 Unspecified ovarian cyst, unspecified side: Secondary | ICD-10-CM | POA: Insufficient documentation

## 2012-11-26 ENCOUNTER — Other Ambulatory Visit: Payer: Self-pay

## 2012-11-26 ENCOUNTER — Encounter: Payer: Self-pay | Admitting: Obstetrics & Gynecology

## 2012-11-26 ENCOUNTER — Ambulatory Visit (INDEPENDENT_AMBULATORY_CARE_PROVIDER_SITE_OTHER): Payer: Medicaid Other | Admitting: Obstetrics

## 2012-11-26 ENCOUNTER — Encounter: Payer: Self-pay | Admitting: Obstetrics

## 2012-11-26 VITALS — BP 111/78 | HR 65 | Temp 97.6°F | Ht 62.0 in | Wt 143.8 lb

## 2012-11-26 DIAGNOSIS — Z23 Encounter for immunization: Secondary | ICD-10-CM

## 2012-11-26 DIAGNOSIS — M129 Arthropathy, unspecified: Secondary | ICD-10-CM

## 2012-11-26 DIAGNOSIS — Z139 Encounter for screening, unspecified: Secondary | ICD-10-CM

## 2012-11-26 DIAGNOSIS — M1990 Unspecified osteoarthritis, unspecified site: Secondary | ICD-10-CM

## 2012-11-26 DIAGNOSIS — Z3202 Encounter for pregnancy test, result negative: Secondary | ICD-10-CM

## 2012-11-26 DIAGNOSIS — N950 Postmenopausal bleeding: Secondary | ICD-10-CM

## 2012-11-26 LAB — POCT URINE PREGNANCY: Preg Test, Ur: NEGATIVE

## 2012-11-26 MED ORDER — SULINDAC 200 MG PO TABS: 200.0000 mg | ORAL_TABLET | Freq: Two times a day (BID) | ORAL | Status: DC

## 2012-11-26 NOTE — Progress Notes (Signed)
.   Subjective:     Anna Carlson is a 52 y.o. female here for her test results.   No current complaints.  Has H/O one episode of postmenopausal bleeding.  Personal health questionnaire reviewed: yes.   Gynecologic History Patient's last menstrual period was 10/24/2012. Contraception: none Last Pap:10/2012. Results were: normal Last mammogram:10/2011. Normal  Obstetric History OB History   Grav Para Term Preterm Abortions TAB SAB Ect Mult Living                   The following portions of the patient's history were reviewed and updated as appropriate: allergies, current medications, past family history, past medical history, past social history, past surgical history and problem list.  Review of Systems Pertinent items are noted in HPI.    Objective:    General appearance: alert and no distress Abdomen: normal findings: soft, non-tender Pelvic: cervix normal in appearance, external genitalia normal, no adnexal masses or tenderness, no cervical motion tenderness, uterus normal size, shape, and consistency and vagina normal without discharge    Assessment:    Healthy female exam.   Postmenopausal Bleeding - one episode, like a period.   Plan:    Education reviewed: Postmenopausal Bleeding - management.. Follow up in: 2 weeks. Endometrial Biopsy done.

## 2012-11-26 NOTE — Progress Notes (Signed)
Endometrial Biopsy Procedure Note  Pre-operative Diagnosis: Postmenopausal Bleeding  Post-operative Diagnosis: same  Indications: postmenopausal bleeding  Procedure Details   Urine pregnancy test was not done.  The risks (including infection, bleeding, pain, and uterine perforation) and benefits of the procedure were explained to the patient and Written informed consent was obtained.  Antibiotic prophylaxis against endocarditis was not indicated.   The patient was placed in the dorsal lithotomy position.  Bimanual exam showed the uterus to be in the neutral position.  A Graves' speculum inserted in the vagina, and the cervix prepped with povidone iodine.  Endocervical curettage with a Kevorkian curette was performed.   A sharp tenaculum was applied to the anterior lip of the cervix for stabilization.  A sterile uterine sound was used to sound the uterus to a depth of 7cm.  A Pipelle endometrial aspirator was used to sample the endometrium.  Sample was sent for pathologic examination.  Condition: Stable  Complications: None  Plan:  The patient was advised to call for any fever or for prolonged or severe pain or bleeding. She was advised to use NSAID as needed for mild to moderate pain. She was advised to avoid vaginal intercourse for 48 hours or until the bleeding has completely stopped.  Attending Physician Documentation: I was present for or participated in the entire procedure, including opening and closing.

## 2012-11-27 ENCOUNTER — Encounter: Payer: Self-pay | Admitting: Obstetrics

## 2013-01-02 ENCOUNTER — Ambulatory Visit (HOSPITAL_COMMUNITY)
Admission: RE | Admit: 2013-01-02 | Discharge: 2013-01-02 | Disposition: A | Discharge status: Home / Self Care | Payer: Medicaid Other | Source: Ambulatory Visit | Attending: Obstetrics | Admitting: Obstetrics

## 2013-01-02 DIAGNOSIS — Z139 Encounter for screening, unspecified: Secondary | ICD-10-CM

## 2013-01-02 DIAGNOSIS — Z1231 Encounter for screening mammogram for malignant neoplasm of breast: Secondary | ICD-10-CM | POA: Insufficient documentation

## 2013-01-06 ENCOUNTER — Other Ambulatory Visit: Payer: Self-pay | Admitting: Obstetrics

## 2013-01-06 DIAGNOSIS — R928 Other abnormal and inconclusive findings on diagnostic imaging of breast: Secondary | ICD-10-CM

## 2013-01-16 ENCOUNTER — Ambulatory Visit
Admission: RE | Admit: 2013-01-16 | Discharge: 2013-01-16 | Disposition: A | Discharge status: Home / Self Care | Payer: Medicaid Other | Source: Ambulatory Visit | Attending: Obstetrics | Admitting: Obstetrics

## 2013-01-16 DIAGNOSIS — R928 Other abnormal and inconclusive findings on diagnostic imaging of breast: Secondary | ICD-10-CM

## 2013-04-24 ENCOUNTER — Other Ambulatory Visit: Payer: Self-pay

## 2013-07-07 ENCOUNTER — Emergency Department (HOSPITAL_COMMUNITY): Payer: Medicaid Other

## 2013-07-07 ENCOUNTER — Other Ambulatory Visit (HOSPITAL_COMMUNITY): Payer: Self-pay | Admitting: Physician Assistant

## 2013-07-07 ENCOUNTER — Emergency Department (HOSPITAL_COMMUNITY)
Admission: EM | Admit: 2013-07-07 | Discharge: 2013-07-07 | Disposition: A | Discharge status: Home / Self Care | Payer: Medicaid Other | Attending: Emergency Medicine | Admitting: Emergency Medicine

## 2013-07-07 ENCOUNTER — Encounter (HOSPITAL_COMMUNITY): Payer: Self-pay | Admitting: Emergency Medicine

## 2013-07-07 ENCOUNTER — Ambulatory Visit (HOSPITAL_COMMUNITY)
Admission: RE | Admit: 2013-07-07 | Discharge: 2013-07-07 | Disposition: A | Discharge status: Home / Self Care | Payer: Medicaid Other | Source: Ambulatory Visit | Attending: Emergency Medicine | Admitting: Emergency Medicine

## 2013-07-07 DIAGNOSIS — S8000XA Contusion of unspecified knee, initial encounter: Secondary | ICD-10-CM | POA: Insufficient documentation

## 2013-07-07 DIAGNOSIS — Z87891 Personal history of nicotine dependence: Secondary | ICD-10-CM | POA: Insufficient documentation

## 2013-07-07 DIAGNOSIS — Y929 Unspecified place or not applicable: Secondary | ICD-10-CM | POA: Insufficient documentation

## 2013-07-07 DIAGNOSIS — M25561 Pain in right knee: Secondary | ICD-10-CM

## 2013-07-07 DIAGNOSIS — M7989 Other specified soft tissue disorders: Secondary | ICD-10-CM

## 2013-07-07 DIAGNOSIS — IMO0002 Reserved for concepts with insufficient information to code with codable children: Secondary | ICD-10-CM | POA: Insufficient documentation

## 2013-07-07 DIAGNOSIS — M79609 Pain in unspecified limb: Secondary | ICD-10-CM | POA: Insufficient documentation

## 2013-07-07 DIAGNOSIS — M129 Arthropathy, unspecified: Secondary | ICD-10-CM | POA: Insufficient documentation

## 2013-07-07 DIAGNOSIS — I10 Essential (primary) hypertension: Secondary | ICD-10-CM | POA: Insufficient documentation

## 2013-07-07 DIAGNOSIS — M25569 Pain in unspecified knee: Secondary | ICD-10-CM | POA: Insufficient documentation

## 2013-07-07 DIAGNOSIS — M79606 Pain in leg, unspecified: Secondary | ICD-10-CM

## 2013-07-07 DIAGNOSIS — Z79899 Other long term (current) drug therapy: Secondary | ICD-10-CM | POA: Insufficient documentation

## 2013-07-07 DIAGNOSIS — M25521 Pain in right elbow: Secondary | ICD-10-CM

## 2013-07-07 DIAGNOSIS — X58XXXA Exposure to other specified factors, initial encounter: Secondary | ICD-10-CM | POA: Insufficient documentation

## 2013-07-07 DIAGNOSIS — M25539 Pain in unspecified wrist: Secondary | ICD-10-CM | POA: Insufficient documentation

## 2013-07-07 DIAGNOSIS — I209 Angina pectoris, unspecified: Secondary | ICD-10-CM | POA: Insufficient documentation

## 2013-07-07 DIAGNOSIS — R209 Unspecified disturbances of skin sensation: Secondary | ICD-10-CM | POA: Insufficient documentation

## 2013-07-07 DIAGNOSIS — Y939 Activity, unspecified: Secondary | ICD-10-CM | POA: Insufficient documentation

## 2013-07-07 DIAGNOSIS — I739 Peripheral vascular disease, unspecified: Secondary | ICD-10-CM | POA: Insufficient documentation

## 2013-07-07 MED ORDER — IBUPROFEN 800 MG PO TABS: 800.0000 mg | ORAL_TABLET | Freq: Four times a day (QID) | ORAL | Status: DC | PRN

## 2013-07-07 MED ORDER — ENOXAPARIN SODIUM 80 MG/0.8ML ~~LOC~~ SOLN
70.0000 mg | SUBCUTANEOUS | Status: AC
  Administered 2013-07-07: 70 mg via SUBCUTANEOUS
  Filled 2013-07-07: qty 0.8

## 2013-07-07 MED ORDER — IBUPROFEN 400 MG PO TABS
800.0000 mg | ORAL_TABLET | Freq: Once | ORAL | Status: AC
  Administered 2013-07-07: 800 mg via ORAL
  Filled 2013-07-07: qty 2

## 2013-07-07 NOTE — ED Provider Notes (Signed)
Medical screening examination/treatment/procedure(s) were conducted as a shared visit with non-physician practitioner(s) or resident  and myself.  I personally evaluated the patient during the encounter and agree with the findings and plan unless otherwise indicated.    I have personally reviewed any xrays and/ or EKG's with the provider and I agree with interpretation.   Right medial knee pain, no injury.  PVD, doppled pulses in ED.  No significant swelling, no posterior leg tenderness, medial joint line tenderness right knee on exam, sensation intact distal, full rom, cap refill mild delayed distal.  Ligaments stable on exam.  Lovenox and [place] in am.  Pt comfortable with plan.  Right leg pain  Mariea Clonts, MD 07/07/13 [phone]

## 2013-07-07 NOTE — Progress Notes (Signed)
VASCULAR LAB PRELIMINARY  PRELIMINARY  PRELIMINARY  PRELIMINARY  Bilateral lower extremity venous completed.    Preliminary report:  There is no DVT or SVT noted in the bilateral lower extremities.  Asenath Balash, RVT 07/07/2013, 10:28 AM

## 2013-07-07 NOTE — ED Notes (Signed)
The pt has pain in her rt knee just noticed this past Thursday.  No known injury

## 2013-07-07 NOTE — Discharge Instructions (Signed)
Arthralgia  Your caregiver has diagnosed you as suffering from an arthralgia. Arthralgia means there is pain in a joint. This can come from many reasons including:  · Bruising the joint which causes soreness (inflammation) in the joint.  · Wear and tear on the joints which occur as we grow older (osteoarthritis).  · Overusing the joint.  · Various forms of arthritis.  · Infections of the joint.  Regardless of the cause of pain in your joint, most of these different pains respond to anti-inflammatory drugs and rest. The exception to this is when a joint is infected, and these cases are treated with antibiotics, if it is a bacterial infection.  HOME CARE INSTRUCTIONS   · Rest the injured area for as long as directed by your caregiver. Then slowly start using the joint as directed by your caregiver and as the pain allows. Crutches as directed may be useful if the ankles, knees or hips are involved. If the knee was splinted or casted, continue use and care as directed. If an stretchy or elastic wrapping bandage has been applied today, it should be removed and re-applied every 3 to 4 hours. It should not be applied tightly, but firmly enough to keep swelling down. Watch toes and feet for swelling, bluish discoloration, coldness, numbness or excessive pain. If any of these problems (symptoms) occur, remove the ace bandage and re-apply more loosely. If these symptoms persist, contact your caregiver or return to this location.  · For the first 24 hours, keep the injured extremity elevated on pillows while lying down.  · Apply ice for 15-20 minutes to the sore joint every couple hours while awake for the first half day. Then 03-04 times per day for the first 48 hours. Put the ice in a plastic bag and place a towel between the bag of ice and your skin.  · Wear any splinting, casting, elastic bandage applications, or slings as instructed.  · Only take over-the-counter or prescription medicines for pain, discomfort, or fever as  directed by your caregiver. Do not use aspirin immediately after the injury unless instructed by your physician. Aspirin can cause increased bleeding and bruising of the tissues.  · If you were given crutches, continue to use them as instructed and do not resume weight bearing on the sore joint until instructed.  Persistent pain and inability to use the sore joint as directed for more than 2 to 3 days are warning signs indicating that you should see a caregiver for a follow-up visit as soon as possible. Initially, a hairline fracture (break in bone) may not be evident on X-rays. Persistent pain and swelling indicate that further evaluation, non-weight bearing or use of the joint (use of crutches or slings as instructed), or further X-rays are indicated. X-rays may sometimes not show a small fracture until a week or 10 days later. Make a follow-up appointment with your own caregiver or one to whom we have referred you. A radiologist (specialist in reading X-rays) may read your X-rays. Make sure you know how you are to obtain your X-ray results. Do not assume everything is normal if you do not hear from us.  SEEK MEDICAL CARE IF:  Bruising, swelling, or pain increases.  SEEK IMMEDIATE MEDICAL CARE IF:   · Your fingers or toes are numb or blue.  · The pain is not responding to medications and continues to stay the same or get worse.  · The pain in your joint becomes severe.  · You   develop a fever over 102° F (38.9° C).  · It becomes impossible to move or use the joint.  MAKE SURE YOU:   · Understand these instructions.  · Will watch your condition.  · Will get help right away if you are not doing well or get worse.  Document Released: 06/05/2005 Document Revised: 08/28/2011 Document Reviewed: 01/22/2008  Exit[employer]® Patient Information ©2014 [employer].

## 2013-07-07 NOTE — ED Notes (Signed)
Patient transported to X-ray 

## 2013-07-07 NOTE — ED Provider Notes (Signed)
CSN: [id number]     Arrival date & time 07/07/13  0033 History   First MD Initiated Contact with Patient 07/07/13 0056     Chief Complaint  Patient presents with  . Knee Pain   (Consider location/radiation/quality/duration/timing/severity/associated sxs/prior Treatment) HPI Comments: Patient is 53 year old female with PMHx significant for arthritis, HTN and angina who presents to the ED with complaints of right knee pain - specifically she has noticed a knot to the medial portion of her right knee that is painful to touch.  Patient denies any injury to the knee.  She states that she saw her PCP (Dr. Rosaland Lao) earlier last week who told her he did not feel pulses in her legs, she states that she has been having numbness to her feet over the past year.  She states that he used the doppler and states they were there but did not order any further testing.  She is concerned that she may have a blood clot in her leg.  She denies chest pain, shortness of breath, nausea, vomiting, or any other concerns.  Patient is a 53 y.o. female presenting with knee pain. The history is provided by the patient. No language interpreter was used.  Knee Pain Location:  Knee Time since incident:  5 days Injury: no   Knee location:  R knee Pain details:    Quality:  Aching   Radiates to:  Does not radiate   Severity:  Moderate   Onset quality:  Sudden   Duration:  5 days   Timing:  Constant   Progression:  Worsening Chronicity:  New Dislocation: no   Foreign body present:  No foreign bodies Tetanus status:  Up to date Prior injury to area:  No Relieved by:  Nothing Worsened by:  Nothing tried Ineffective treatments:  None tried Associated symptoms: swelling   Associated symptoms: no back pain, no decreased ROM, no fatigue, no fever, no itching, no muscle weakness, no neck pain, no numbness, no stiffness and no tingling     Past Medical History  Diagnosis Date  . Arthritis   . Hypertension   . Angina     History reviewed. No pertinent past surgical history. No family history on file. History  Substance Use Topics  . Smoking status: Former Research scientist (life sciences)  . Smokeless tobacco: Not on file  . Alcohol Use: Yes   OB History   Grav Para Term Preterm Abortions TAB SAB Ect Mult Living                 Review of Systems  Constitutional: Negative for fever and fatigue.  Musculoskeletal: Negative for back pain, neck pain and stiffness.  Skin: Negative for itching.  All other systems reviewed and are negative.    Allergies  Review of patient's allergies indicates no known allergies.  Home Medications   Current Outpatient Rx  Name  Route  Sig  Dispense  Refill  . Cholecalciferol (VITAMIN D PO)   Oral   Take 1 tablet by mouth daily.         . ferrous sulfate 325 (65 FE) MG tablet   Oral   Take 325 mg by mouth daily with breakfast.         . furosemide (LASIX) 20 MG tablet   Oral   Take 20 mg by mouth daily.         . hydroxychloroquine (PLAQUENIL) 200 MG tablet   Oral   Take 200 mg by mouth 2 (two) times daily.         Marland Kitchen  metoprolol (LOPRESSOR) 100 MG tablet   Oral   Take 100 mg by mouth 2 (two) times daily.         . minocycline (MINOCIN,DYNACIN) 100 MG capsule   Oral   Take 100 mg by mouth 2 (two) times daily.         . Multiple Vitamin (MULITIVITAMIN WITH MINERALS) TABS   Oral   Take 1 tablet by mouth daily.         . predniSONE (DELTASONE) 5 MG tablet   Oral   Take 5 mg by mouth daily.          . sulindac (CLINORIL) 200 MG tablet   Oral   Take 1 tablet (200 mg total) by mouth 2 (two) times daily.   60 tablet   11    BP 149/77  Pulse 76  Temp(Src) 97.8 F (36.6 C)  Resp 18  Ht 5\' 2"  (1.575 m)  Wt 148 lb (67.132 kg)  BMI 27.06 kg/m2  SpO2 99% Physical Exam  Nursing note and vitals reviewed. Constitutional: She is oriented to person, place, and time. She appears well-developed and well-nourished. No distress.  HENT:  Head: Normocephalic and  atraumatic.  Right Ear: External ear normal.  Left Ear: External ear normal.  Nose: Nose normal.  Mouth/Throat: Oropharynx is clear and moist. No oropharyngeal exudate.  Eyes: Conjunctivae are normal. Pupils are equal, round, and reactive to light. No scleral icterus.  Neck: Normal range of motion. Neck supple.  Cardiovascular: Normal rate, regular rhythm and normal heart sounds.  Exam reveals no gallop and no friction rub.   No murmur heard. Pulmonary/Chest: Effort normal and breath sounds normal. No respiratory distress. She has no wheezes. She has no rales. She exhibits no tenderness.  Abdominal: Soft. Bowel sounds are normal. She exhibits no distension. There is no tenderness.  Musculoskeletal:       Right knee: She exhibits ecchymosis. She exhibits normal range of motion, no swelling, no effusion and normal alignment. Tenderness found. Medial joint line tenderness noted. No lateral joint line and no patellar tendon tenderness noted.       Right lower leg: She exhibits no tenderness, no bony tenderness, no swelling and no edema.       Legs: No palpable cord noted, negative Homan's sign  Lymphadenopathy:    She has no cervical adenopathy.  Neurological: She is alert and oriented to person, place, and time. She exhibits normal muscle tone. Coordination normal.  Skin: Skin is warm and dry. No rash noted. No erythema.  Several small superficial veins noted in lower extremities - warm to touch, 1+ dopplerable DP, PT pulses in both legs.  Psychiatric: She has a normal mood and affect. Her behavior is normal. Judgment and thought content normal.    ED Course  Procedures (including critical care time) Labs Review Labs Reviewed - No data to display Imaging Review Dg Elbow Complete Right  07/07/2013   CLINICAL DATA:  Fall with  olecranon pain.  EXAM: RIGHT ELBOW - COMPLETE 3+ VIEW  COMPARISON:  None.  FINDINGS: Degenerative irregularity about the ulnar aspect of the elbow. No acute fracture  or dislocation. No joint effusion.  IMPRESSION: Degenerative change, without acute osseous finding.   Electronically Signed   By: Abigail Miyamoto M.D.   On: 07/07/2013 01:47   Dg Knee Complete 4 Views Right  07/07/2013   CLINICAL DATA:  Fall with palpable abnormality medially.  EXAM: RIGHT KNEE - COMPLETE 4+ VIEW  COMPARISON:  DG  KNEE 2 VIEWS*R* dated 06/25/2006  FINDINGS: Moderate medial and mild lateral compartment joint space narrowing. Osteophyte formation medially. Progressive since the prior. Moderate patellofemoral joint space narrowing. No acute fracture or dislocation. No joint effusion.  IMPRESSION: Moderate and progressive osteoarthritis, without acute osseous abnormality.   Electronically Signed   By: Abigail Miyamoto M.D.   On: 07/07/2013 01:45    EKG Interpretation   None     3:04 AM Patient also complains of right elbow pain that is occasional when she moves the elbow the wrong way.  She denies numbness tingling, swelling of the joint.  MDM  Right knee pain Right elbow pain   Patient here with right knee pain - though there is likely a superficial clot to the right medial knee area, I cannot effectively rule out any deep vein thrombus.  She obviously has PVD but has dopplerable pulses at this time.  Will give the patient lovenox here and she will return for vascular doppler studies of both lower extremities in the morning.    Idalia Needle Joelyn Oms, [place] 07/07/13 [id number]

## 2013-07-07 NOTE — ED Notes (Signed)
Patient returned from xray.

## 2013-07-07 NOTE — ED Notes (Signed)
Dorsalis pedis pulses found by palpation in bilateral feet and present by doppler.  Posterior tibial found by palpation on left foot and present by doppler.  Posterior tibial not palpated on right foot, present by doppler.

## 2013-11-28 ENCOUNTER — Ambulatory Visit (INDEPENDENT_AMBULATORY_CARE_PROVIDER_SITE_OTHER): Payer: Medicaid Other | Admitting: Podiatry

## 2013-11-28 ENCOUNTER — Encounter: Payer: Self-pay | Admitting: Podiatry

## 2013-11-28 VITALS — BP 121/83 | HR 68 | Ht 62.0 in | Wt 144.0 lb

## 2013-11-28 DIAGNOSIS — M21969 Unspecified acquired deformity of unspecified lower leg: Secondary | ICD-10-CM | POA: Insufficient documentation

## 2013-11-28 DIAGNOSIS — M216X9 Other acquired deformities of unspecified foot: Secondary | ICD-10-CM

## 2013-11-28 DIAGNOSIS — M79606 Pain in leg, unspecified: Secondary | ICD-10-CM | POA: Insufficient documentation

## 2013-11-28 DIAGNOSIS — M79609 Pain in unspecified limb: Secondary | ICD-10-CM

## 2013-11-28 DIAGNOSIS — M2040 Other hammer toe(s) (acquired), unspecified foot: Secondary | ICD-10-CM

## 2013-11-28 DIAGNOSIS — I739 Peripheral vascular disease, unspecified: Secondary | ICD-10-CM

## 2013-11-28 HISTORY — DX: Peripheral vascular disease, unspecified: I73.9

## 2013-11-28 NOTE — Patient Instructions (Signed)
Seen for painful feet. Noted of poor blood flow on both feet. Will make referral to Vascular specialist. Return in one month.

## 2013-11-28 NOTE — Progress Notes (Signed)
Subjective: 53 year old female presents accompanied by her husband complaining of swelling on ankle prolonged walk, which started in year 2000.  Starts swelling after been on a half of a day.  Also has pain with palpable bump under the 2nd toe both feet x 2 years.  On feet off and on during the day. Does not work.  Some surgeries done in 2010 - 2011. Had some tendon work to straighten toes. She has had contracted toes that hurt in shoes.   Review of Systems - General ROS: negative for - chills, fatigue, fever, night sweats, sleep disturbance, weight gain or weight loss Ophthalmic ROS: negative ENT ROS: negative Breast ROS: negative for breast lumps Respiratory ROS: no cough, shortness of breath, or wheezing Cardiovascular ROS: no chest pain or dyspnea on exertion Gastrointestinal ROS: no abdominal pain, change in bowel habits, or black or bloody stools Genito-Urinary ROS: no dysuria, trouble voiding, or hematuria Musculoskeletal ROS: Aches from Arthritis in arms, knees, legs, and feet. Has Rheumatoid since 2000. Neurological ROS: no TIA or stroke symptoms Dermatological ROS: negative.  Objective: Dermatologic: Multiple scar forefoot from previous digital and metatarsal surgeries bilateral.  Vascular: Pedal pulses are not palpable except faint palpation on left Posterior tibial pulse.  Mild ankle edema bilateral. Neurologic: Subjective foot pain and leg pain after been on feet a few hours.  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 Enlarged MPJ upon palpation right 2nd MPJ and left 4th MPJ. Stiff joint at previous surgical site, 2nd and 3rd MPJ bilateral. Uneven plantar metatarsophalangeal joints and weight bearing surface bilateral.  Assessment: Rule out Peripheral Arterial Disease. Rule out ischemic foot pain. Abnormal weight distribution on lesser metatarsophalangeal joints 2-4 bilateral. 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  Plan:  Reviewed clinical findings and available  treatment options. Patient wants to have surgical intervention of the deformed joints and digits.  Explained high risk status due to questionable vascular status.  Referral made to vascular specialty.  Monitor monthly.

## 2013-12-04 ENCOUNTER — Other Ambulatory Visit: Payer: Self-pay | Admitting: *Deleted

## 2013-12-04 DIAGNOSIS — M79669 Pain in unspecified lower leg: Secondary | ICD-10-CM

## 2013-12-29 ENCOUNTER — Ambulatory Visit: Payer: Medicaid Other | Admitting: Podiatry

## 2014-01-14 ENCOUNTER — Other Ambulatory Visit: Payer: Self-pay | Admitting: Obstetrics

## 2014-01-14 DIAGNOSIS — Z1231 Encounter for screening mammogram for malignant neoplasm of breast: Secondary | ICD-10-CM

## 2014-01-21 ENCOUNTER — Encounter: Payer: Self-pay | Admitting: Vascular Surgery

## 2014-01-22 ENCOUNTER — Ambulatory Visit (HOSPITAL_COMMUNITY)
Admission: RE | Admit: 2014-01-22 | Discharge: 2014-01-22 | Disposition: A | Discharge status: Home / Self Care | Payer: Medicaid Other | Source: Ambulatory Visit | Attending: Vascular Surgery | Admitting: Vascular Surgery

## 2014-01-22 ENCOUNTER — Ambulatory Visit (INDEPENDENT_AMBULATORY_CARE_PROVIDER_SITE_OTHER): Payer: Medicaid Other | Admitting: Vascular Surgery

## 2014-01-22 ENCOUNTER — Encounter: Payer: Self-pay | Admitting: Vascular Surgery

## 2014-01-22 VITALS — BP 135/79 | HR 62 | Resp 16 | Ht 62.0 in | Wt 140.2 lb

## 2014-01-22 DIAGNOSIS — M79605 Pain in left leg: Principal | ICD-10-CM

## 2014-01-22 DIAGNOSIS — M79669 Pain in unspecified lower leg: Secondary | ICD-10-CM

## 2014-01-22 DIAGNOSIS — M79609 Pain in unspecified limb: Secondary | ICD-10-CM | POA: Insufficient documentation

## 2014-01-22 DIAGNOSIS — I739 Peripheral vascular disease, unspecified: Secondary | ICD-10-CM | POA: Insufficient documentation

## 2014-01-22 DIAGNOSIS — M79604 Pain in right leg: Secondary | ICD-10-CM | POA: Insufficient documentation

## 2014-01-22 NOTE — Progress Notes (Signed)
HISTORY AND PHYSICAL     CC:  Concerned about blood flow in her feet  Sheard, Myeong, DPM   HPI: This is a 53 y.o. female who presents today with c/o pain in her feet.  She has had hx of bilateral foot surgery.  She states that she does have pain in legs from arthritis.  She has shooting pains in her feet at night.  She states that she does have some mild swelling of her ankles.  She has discoloration of both her feet, but this has continued to improve over time.  She denies any hx of diabetes.  She smoked briefly as a teenager.  She states that she has occasional chest pressure and DOE.  She states that she has not had this evaluated.  She presents today for evaluation of blood flow to her feet for more more surgery on her feet to make sure she can heal the wound.  She takes aspirin daily.  She is on a beta blocker for hypertension, which she states is well controlled.    Past Medical History  Diagnosis Date  . Arthritis   . Hypertension   . Angina   . PVD (peripheral vascular disease) 11/28/2013  . Bilateral leg pain    Past Surgical History  Procedure Laterality Date  . Arthroscopy knee w/ drilling Right 2008  . Foot surgery Bilateral 2008,2009    No Known Allergies  Current Outpatient Prescriptions  Medication Sig Dispense Refill  . Adalimumab (HUMIRA) 40 MG/0.8ML PSKT Inject into the skin. Twice monthly      . aspirin 81 MG tablet Take 81 mg by mouth daily.      . Cholecalciferol (VITAMIN D PO) Take 1 tablet by mouth daily.      . ferrous sulfate 325 (65 FE) MG tablet Take 325 mg by mouth daily with breakfast.      . furosemide (LASIX) 20 MG tablet Take 20 mg by mouth daily.      Marland Kitchen gabapentin (NEURONTIN) 300 MG capsule Take 300 mg by mouth daily.      . hydroxychloroquine (PLAQUENIL) 200 MG tablet Take 200 mg by mouth 2 (two) times daily.      Marland Kitchen ibuprofen (ADVIL,MOTRIN) 800 MG tablet Take 1 tablet (800 mg total) by mouth every 6 (six) hours as needed for moderate pain.   30 tablet  0  . metoprolol (LOPRESSOR) 100 MG tablet Take 100 mg by mouth 2 (two) times daily.      . minocycline (MINOCIN,DYNACIN) 100 MG capsule Take 100 mg by mouth 2 (two) times daily.      . Multiple Vitamin (MULITIVITAMIN WITH MINERALS) TABS Take 1 tablet by mouth daily.      . predniSONE (DELTASONE) 5 MG tablet Take 5 mg by mouth daily.       . sulindac (CLINORIL) 200 MG tablet Take 1 tablet (200 mg total) by mouth 2 (two) times daily.  60 tablet  11   No current facility-administered medications for this visit.    History reviewed. No pertinent family history.  History   Social History  . Marital Status: Single    Spouse Name: N/A    Number of Children: N/A  . Years of Education: N/A   Occupational History  . Not on file.   Social History Main Topics  . Smoking status: Former Research scientist (life sciences)  . Smokeless tobacco: Never Used  . Alcohol Use: Yes  . Drug Use: No  . Sexual Activity: Not on file  Other Topics Concern  . Not on file   Social History Narrative  . No narrative on file     ROS: [x]  Positive   [ ]  Negative   [ ]  All sytems reviewed and are negative  Cardiovascular: [x]  chest pain/pressure []  palpitations [x]  SOB lying flat [x]  DOE [x]  pain in legs while walking-relates this to arthritis [x]  pain in feet when lying flat []  hx of DVT []  hx of phlebitis [x]  swelling in legs [x]  varicose veins  Pulmonary: []  productive cough []  asthma []  wheezing  Neurologic: []  weakness in []  arms []  legs []  numbness in []  arms []  legs [] difficulty speaking or slurred speech []  temporary loss of vision in one eye []  dizziness  Hematologic: []  bleeding problems []  problems with blood clotting easily  GI []  vomiting blood []  blood in stool  GU: []  burning with urination []  blood in urine  Psychiatric: []  hx of major depression  Integumentary: []  rashes []  ulcers  Constitutional: []  fever []  chills   PHYSICAL EXAMINATION:  Filed Vitals:    01/22/14 1228  BP: 135/79  Pulse: 62  Resp: 16   Body mass index is 25.64 kg/(m^2).  General:  WDWN in NAD Gait: Not observed HENT: WNL, normocephalic Eyes: Pupils equal Pulmonary: normal non-labored breathing , without Rales, rhonchi,  wheezing Cardiac: RRR, without  Murmurs, rubs or gallops; without carotid bruits Abdomen: soft, NT, no masses Skin: without rashes, without ulcers; skin color changes of feet bilaterally Vascular Exam/Pulses:  Right Left  Radial 2+ (normal) 2+ (normal)  Ulnar Unable to palpate Unable to palpate  Femoral 2+ (normal) 2+ (normal)  Popliteal Unable to palpate Unable to palpate  DP 1+ (weak) 1+ (weak)  PT Unable to palpate Unable to palpate   Extremities: without ischemic changes, without Gangrene , without cellulitis; without open wounds; well healed scars x 2 left foot and x 1 on right foot. Musculoskeletal: no muscle wasting or atrophy  Neurologic: A&O X 3; Appropriate Affect ; SENSATION: normal; MOTOR FUNCTION:  moving all extremities equally. Speech is fluent/normal Lymphatic:  No inguinal lymphadenopathy   Non-Invasive Vascular Imaging:   ABI's 01/22/14:  Right:  1.07 Left:  1.14  Pt meds includes: Statin:  No. Beta Blocker:  Yes.   Aspirin:  Yes.   ACEI:  No. ARB:  No. Other Antiplatelet/Anticoagulant:  No.   ASSESSMENT/PLAN:: 53 y.o. female with bilateral foot pain who has been sent to our office to have her lower extremity circulation evaluated ahead of foot surgery   -pt had ABI's today, which are normal.  She does not have hx of DM or tobacco use.  Given her normal ABI's, she should be able to heal wounds for foot surgery.   -her pain is most likely not related to arterial disease -she will f/u with [place] prn    Leontine Locket, PA-C [facility] [phone]  Clinic MD:  Pt seen and examined in conjunction with Dr. Oneida Alar  History and exam details as above. Patient has pain primarily in her right ankle which  is fairly random and when it occurs. Usually occurs without activity. She does not describe what I would consider arterial rest pain. She does not have claudication. Although her pedal pulses are not easily palpable, she has normal ABIs today were triphasic waveforms suggesting normal arterial circulation to both lower extremities. She also has normal digit pressures greater than 100 bilaterally.  She should have adequate circulation for wound healing. She will followup with  [place] on as-needed basis.  Ruta Hinds, MD [facility] [place] Office: [phone] Pager: [phone]

## 2014-01-28 ENCOUNTER — Ambulatory Visit: Payer: Medicaid Other | Admitting: Podiatry

## 2014-01-29 ENCOUNTER — Ambulatory Visit (HOSPITAL_COMMUNITY)
Admission: RE | Admit: 2014-01-29 | Discharge: 2014-01-29 | Disposition: A | Discharge status: Home / Self Care | Payer: Medicaid Other | Source: Ambulatory Visit | Attending: Obstetrics | Admitting: Obstetrics

## 2014-01-29 DIAGNOSIS — Z1231 Encounter for screening mammogram for malignant neoplasm of breast: Secondary | ICD-10-CM | POA: Diagnosis present

## 2014-06-23 ENCOUNTER — Encounter (HOSPITAL_COMMUNITY): Payer: Self-pay | Admitting: Emergency Medicine

## 2014-06-23 ENCOUNTER — Emergency Department (HOSPITAL_COMMUNITY)
Admission: EM | Admit: 2014-06-23 | Discharge: 2014-06-24 | Disposition: A | Discharge status: Home / Self Care | Payer: Medicaid Other | Attending: Emergency Medicine | Admitting: Emergency Medicine

## 2014-06-23 DIAGNOSIS — Z791 Long term (current) use of non-steroidal anti-inflammatories (NSAID): Secondary | ICD-10-CM

## 2014-06-23 DIAGNOSIS — Z7952 Long term (current) use of systemic steroids: Secondary | ICD-10-CM | POA: Insufficient documentation

## 2014-06-23 DIAGNOSIS — M069 Rheumatoid arthritis, unspecified: Secondary | ICD-10-CM | POA: Diagnosis not present

## 2014-06-23 DIAGNOSIS — I209 Angina pectoris, unspecified: Secondary | ICD-10-CM | POA: Insufficient documentation

## 2014-06-23 DIAGNOSIS — R200 Anesthesia of skin: Secondary | ICD-10-CM | POA: Diagnosis present

## 2014-06-23 DIAGNOSIS — R21 Rash and other nonspecific skin eruption: Secondary | ICD-10-CM | POA: Insufficient documentation

## 2014-06-23 DIAGNOSIS — Z7982 Long term (current) use of aspirin: Secondary | ICD-10-CM | POA: Insufficient documentation

## 2014-06-23 DIAGNOSIS — Z87891 Personal history of nicotine dependence: Secondary | ICD-10-CM | POA: Insufficient documentation

## 2014-06-23 DIAGNOSIS — M1990 Unspecified osteoarthritis, unspecified site: Secondary | ICD-10-CM | POA: Insufficient documentation

## 2014-06-23 DIAGNOSIS — I10 Essential (primary) hypertension: Secondary | ICD-10-CM | POA: Insufficient documentation

## 2014-06-23 DIAGNOSIS — G500 Trigeminal neuralgia: Secondary | ICD-10-CM | POA: Diagnosis not present

## 2014-06-23 DIAGNOSIS — G8929 Other chronic pain: Secondary | ICD-10-CM | POA: Diagnosis not present

## 2014-06-23 DIAGNOSIS — I739 Peripheral vascular disease, unspecified: Secondary | ICD-10-CM | POA: Insufficient documentation

## 2014-06-23 DIAGNOSIS — R51 Headache: Secondary | ICD-10-CM | POA: Insufficient documentation

## 2014-06-23 DIAGNOSIS — Z792 Long term (current) use of antibiotics: Secondary | ICD-10-CM | POA: Insufficient documentation

## 2014-06-23 DIAGNOSIS — Z79899 Other long term (current) drug therapy: Secondary | ICD-10-CM | POA: Insufficient documentation

## 2014-06-23 DIAGNOSIS — R519 Headache, unspecified: Secondary | ICD-10-CM

## 2014-06-23 NOTE — ED Notes (Signed)
Pt. reports right eye pain onset 2 days ago with headache , seen by her PCP yesterday received medications and prescription for pain with temporary relief.

## 2014-06-24 ENCOUNTER — Emergency Department (HOSPITAL_COMMUNITY): Payer: Medicaid Other

## 2014-06-24 ENCOUNTER — Emergency Department (HOSPITAL_COMMUNITY)
Admission: EM | Admit: 2014-06-24 | Discharge: 2014-06-25 | Disposition: A | Discharge status: Home / Self Care | Payer: Medicaid Other | Source: Home / Self Care | Attending: Emergency Medicine | Admitting: Emergency Medicine

## 2014-06-24 ENCOUNTER — Encounter (HOSPITAL_COMMUNITY): Payer: Self-pay

## 2014-06-24 DIAGNOSIS — G500 Trigeminal neuralgia: Secondary | ICD-10-CM

## 2014-06-24 DIAGNOSIS — R200 Anesthesia of skin: Secondary | ICD-10-CM

## 2014-06-24 LAB — SEDIMENTATION RATE: SED RATE: 3 mm/h (ref 0–22)

## 2014-06-24 LAB — C-REACTIVE PROTEIN: CRP: <0.5 mg/dL — ABNORMAL LOW (ref <0.60)

## 2014-06-24 MED ORDER — PREDNISONE 20 MG PO TABS: 60.0000 mg | ORAL_TABLET | Freq: Every day | ORAL | Status: DC

## 2014-06-24 MED ORDER — HYDROCODONE-ACETAMINOPHEN 5-325 MG PO TABS
2.0000 | ORAL_TABLET | Freq: Once | ORAL | Status: AC
  Administered 2014-06-24: 2 via ORAL
  Filled 2014-06-24: qty 2

## 2014-06-24 MED ORDER — PREDNISONE 20 MG PO TABS: 60.0000 mg | ORAL_TABLET | Freq: Once | ORAL | Status: DC

## 2014-06-24 NOTE — ED Provider Notes (Signed)
CSN: [id number]     Arrival date & time 06/24/14  2255 History   First MD Initiated Contact with Patient 06/24/14 2312     Chief Complaint  Patient presents with  . Numbness     (Consider location/radiation/quality/duration/timing/severity/associated sxs/prior Treatment) HPI  This is a 54 year old female with a history of rheumatoid arthritis, with chronic joint pain, on Humira. She is here with a 2 to three-day history of pain and paresthesias of the right side of her face. It started as pain in her right eye and she was seen by her PCP. She was given a steroid shot which improved her symptoms and was supposed to follow-up with an ophthalmologist but this did not occur. When her symptoms worsened she was seen (approximately 24 hours ago) at [facility] for pain in the right side of her face. A sedimentation rate and C-reactive protein were negative. She states that throughout the day today she has had episodes of pain that felt like she was being hit in the side of the head with a baseball. She states the pain is unlike that of a headache. At the present time she is having paresthesias of the right side of the face which she describes as pins and needles. The region she indicates is consistent with the 3 branches of the trigeminal nerve. There is no involvement of the lateral or posterior scalp. She is not having any peripheral neurologic symptoms. There is no motor dysfunction associated. She has bluish grey discolorations around her mouth which she states have improved over the past 3 weeks. She also has bluish grey discolorations of her nailbeds which she states have been there for 6 months. She also has bluish gray macules over distal dorsal feet which she states that been there for years following surgery.   Past Medical History  Diagnosis Date  . Arthritis   . Hypertension   . Angina   . PVD (peripheral vascular disease) 11/28/2013  . Bilateral leg pain    Past Surgical History   Procedure Laterality Date  . Arthroscopy knee w/ drilling Right 2008  . Foot surgery Bilateral 2008,2009   History reviewed. No pertinent family history. History  Substance Use Topics  . Smoking status: Former Research scientist (life sciences)  . Smokeless tobacco: Never Used  . Alcohol Use: Yes   OB History    No data available     Review of Systems  All other systems reviewed and are negative.   Allergies  Review of patient's allergies indicates no known allergies.  Home Medications   Prior to Admission medications   Medication Sig Start Date End Date Taking? Authorizing Provider  Adalimumab (HUMIRA) 40 MG/0.8ML PSKT Inject 40 mg into the skin every 14 (fourteen) days. Twice monthly   Yes Historical Provider, MD  aspirin 81 MG tablet Take 81 mg by mouth daily.   Yes Historical Provider, MD  cholecalciferol (VITAMIN D) 1000 UNITS tablet Take 1,000 Units by mouth daily. 05/05/14  Yes Historical Provider, MD  cyanocobalamin 100 MCG tablet Take 100 mcg by mouth daily.   Yes Historical Provider, MD  ferrous sulfate 325 (65 FE) MG tablet Take 325 mg by mouth daily with breakfast.   Yes Historical Provider, MD  furosemide (LASIX) 20 MG tablet Take 20 mg by mouth daily.   Yes Historical Provider, MD  gabapentin (NEURONTIN) 300 MG capsule Take 300 mg by mouth daily.   Yes Historical Provider, MD  metoprolol succinate (TOPROL-XL) 100 MG 24 hr tablet Take 100 mg  by mouth daily. Take with or immediately following a meal.   Yes Historical Provider, MD  minocycline (MINOCIN,DYNACIN) 100 MG capsule Take 100 mg by mouth 2 (two) times daily.   Yes Historical Provider, MD  omega-3 acid ethyl esters (LOVAZA) 1 G capsule Take 1 g by mouth daily.   Yes Historical Provider, MD  omeprazole (PRILOSEC) 40 MG capsule Take 40 mg by mouth daily as needed (heartburn).  05/27/14  Yes Historical Provider, MD  predniSONE (DELTASONE) 5 MG tablet Take 2.5 mg by mouth daily.  05/27/14  Yes Historical Provider, MD  thiamine (VITAMIN B-1)  100 MG tablet Take 100 mg by mouth daily.   Yes Historical Provider, MD  ibuprofen (ADVIL,MOTRIN) 800 MG tablet Take 1 tablet (800 mg total) by mouth every 6 (six) hours as needed for moderate pain. Patient not taking: Reported on 06/24/2014 07/07/13   Idalia Needle. Sanford, PA-C  predniSONE (DELTASONE) 20 MG tablet Take 3 tablets (60 mg total) by mouth daily. 06/24/14   Carrie Mew, PA-C  sulindac (CLINORIL) 200 MG tablet Take 1 tablet (200 mg total) by mouth 2 (two) times daily. Patient not taking: Reported on 06/24/2014 11/26/12   Shelly Bombard, MD   BP 136/81 mmHg  Pulse 69  Temp(Src) 97.8 F (36.6 C) (Oral)  Resp 20  Ht 5\' 3"  (1.6 m)  Wt 130 lb (58.968 kg)  BMI 23.03 kg/m2  SpO2 97%  LMP 10/20/2012  Physical Exam  General: Well-developed, well-nourished female in no acute distress; appearance consistent with age of record HENT: normocephalic; atraumatic; bluish perioral discoloration Eyes: pupils equal, round and reactive to light; extraocular muscles intact Neck: supple Heart: regular rate and rhythm Lungs: clear to auscultation bilaterally Abdomen: soft; nondistended; nontender; bowel sounds present Extremities: No deformity; full range of motion; pulses normal Neurologic: Awake, alert and oriented but answers questions slowly with; mild dysarthria; motor function intact in all extremities and symmetric; no facial droop; tongue midline; normal coordination; tenderness and hyperesthesia of the right side of the face; negative Romberg; normal finger to nose Skin: Warm and dry; bluish discoloration of the proximal nailbeds; bluish gray macules of the dorsal distal feet bilaterally Psychiatric: Flat affect   ED Course  Procedures (including critical care time)   MDM   Nursing notes and vitals signs, including pulse oximetry, reviewed.  Summary of this visit's results, reviewed by myself:  Labs:  Results for orders placed or performed during the hospital encounter of 06/24/14  (from the past 24 hour(s))  Comprehensive metabolic panel     Status: Abnormal   Collection Time: 06/25/14 12:24 AM  Result Value Ref Range   Sodium 138 135 - 145 mmol/L   Potassium 3.5 3.5 - 5.1 mmol/L   Chloride 107 96 - 112 mEq/L   CO2 25 19 - 32 mmol/L   Glucose, Bld 106 (H) 70 - 99 mg/dL   BUN 23 6 - 23 mg/dL   Creatinine, Ser 0.58 0.50 - 1.10 mg/dL   Calcium 8.7 8.4 - 10.5 mg/dL   Total Protein 6.9 6.0 - 8.3 g/dL   Albumin 4.0 3.5 - 5.2 g/dL   AST 22 0 - 37 U/L   ALT 24 0 - 35 U/L   Alkaline Phosphatase 78 39 - 117 U/L   Total Bilirubin 0.6 0.3 - 1.2 mg/dL   GFR calc non Af Amer >90 >90 mL/min   GFR calc Af Amer >90 >90 mL/min   Anion gap 6 5 - 15  CBC with  Differential     Status: None   Collection Time: 06/25/14 12:24 AM  Result Value Ref Range   WBC 7.4 4.0 - 10.5 K/uL   RBC 5.02 3.87 - 5.11 MIL/uL   Hemoglobin 14.6 12.0 - 15.0 g/dL   HCT 44.8 36.0 - 46.0 %   MCV 89.2 78.0 - 100.0 fL   MCH 29.1 26.0 - 34.0 pg   MCHC 32.6 30.0 - 36.0 g/dL   RDW 14.0 11.5 - 15.5 %   Platelets 167 150 - 400 K/uL   Neutrophils Relative % 61 43 - 77 %   Neutro Abs 4.4 1.7 - 7.7 K/uL   Lymphocytes Relative 31 12 - 46 %   Lymphs Abs 2.3 0.7 - 4.0 K/uL   Monocytes Relative 7 3 - 12 %   Monocytes Absolute 0.5 0.1 - 1.0 K/uL   Eosinophils Relative 1 0 - 5 %   Eosinophils Absolute 0.1 0.0 - 0.7 K/uL   Basophils Relative 0 0 - 1 %   Basophils Absolute 0.0 0.0 - 0.1 K/uL    Imaging Studies: Ct Head Wo Contrast  06/25/2014   CLINICAL DATA:  Acute onset of right-sided facial tingling and right-sided parietal headache for 2 days. Apparent conjunctivitis at the right eye. Initial encounter.  EXAM: CT HEAD WITHOUT CONTRAST  TECHNIQUE: Contiguous axial images were obtained from the base of the skull through the vertex without intravenous contrast.  COMPARISON:  CT of the head performed 04/15/2007  FINDINGS: There is no evidence of acute infarction, mass lesion, or intra- or extra-axial hemorrhage  on CT.  Mild prominence of the sulci suggests mild cortical volume loss. Mild cerebellar atrophy is noted.  The brainstem and fourth ventricle are within normal limits. The basal ganglia are unremarkable in appearance. The cerebral hemispheres demonstrate grossly normal gray-white differentiation. No mass effect or midline shift is seen.  There is no evidence of fracture; visualized osseous structures are unremarkable in appearance. The orbits are within normal limits. The paranasal sinuses and mastoid air cells are well-aerated. No significant soft tissue abnormalities are seen.  IMPRESSION: 1. No acute intracranial pathology seen on CT. 2. Mild cortical volume loss noted.   Electronically Signed   By: Garald Balding M.D.   On: 06/25/2014 00:03   6:55 AM MRI pending. Dr. Mingo Amber will follow up and make disposition.   Wynetta Fines, MD 06/25/14 [phone]

## 2014-06-24 NOTE — Discharge Instructions (Signed)
Your signs and symptoms tonight are concerning for a possible diagnosis of lupus. You had blood tests run tonight, however they will not return tonight with a definitive diagnosis. With this concern it is essentially follow-up with your primary care physician regarding these symptoms. Return to the ER with any severe pain, high fever, change or loss in vision, worsening of symptoms.  Lupus Lupus (also called systemic lupus erythematosus, SLE) is a disorder of the body's natural defense system (immune system). In lupus, the immune system attacks various areas of the body (autoimmune disease). CAUSES The cause is unknown. However, lupus runs in families. Certain genes can make you more likely to develop lupus. It is 10 times more common in women than in men. Lupus is also more common in African Americans and Asians. Other factors also play a role, such as viruses (Epstein-Barr virus, EBV), stress, hormones, cigarette smoke, and certain drugs. SYMPTOMS Lupus can affect many parts of the body, including the joints, skin, kidneys, lungs, heart, nervous system, and blood vessels. The signs and symptoms of lupus differ from person to person. The disease can range from mild to life-threatening. Typical features of lupus include:  Butterfly-shaped rash over the face.  Arthritis involving one or more joints.  Kidney disease.  Fever, weight loss, hair loss, fatigue.  Poor circulation in the fingers and toes (Raynaud's disease).  Chest pain when taking deep breaths. Abdominal pain may also occur.  Skin rash in areas exposed to the sun.  Sores in the mouth and nose. DIAGNOSIS Diagnosing lupus can take a long time and is often difficult. An exam and an accurate account of your symptoms and health problems is very important. Blood tests are necessary, though no single test can confirm or rule out lupus. Most people with lupus test positive for antinuclear antibodies (ANA) on a blood test. Additional blood  tests, a urine test (urinalysis), and sometimes a kidney or skin tissue sample (biopsy) can help to confirm or rule out lupus. TREATMENT There is no cure for lupus. Your caregiver will develop a treatment plan based on your age, sex, health, symptoms, and lifestyle. The goals are to prevent flares, to treat them when they do occur, and to minimize organ damage and complications. How the disease may affect each person varies widely. Most people with lupus can live normal lives, but this disorder must be carefully monitored. Treatment must be adjusted as necessary to prevent serious complications. Medicines used for treatment:  Nonsteroidal anti-inflammatory drugs (NSAIDs) decrease inflammation and can help with chest pain, joint pain, and fevers. Examples include ibuprofen and naproxen.  Antimalarial drugs were designed to treat malaria. They also treat fatigue, joint pain, skin rashes, and inflammation of the lungs in patients with lupus.  Corticosteroids are powerful hormones that rapidly suppress inflammation. The lowest dose with the highest benefit will be chosen. They can be given by cream, pills, injections, and through the vein (intravenously).  Immunosuppressive drugs block the making of immune cells. They may be used for kidney or nerve disease. HOME CARE INSTRUCTIONS  Exercise. Low-impact activities can usually help keep joints flexible without being too strenuous.  Rest after periods of exercise.  Avoid excessive sun exposure.  Follow proper nutrition and take supplements as recommended by your caregiver.  Stress management can be helpful. SEEK MEDICAL CARE IF:  You have increased fatigue.  You develop pain.  You develop a rash.  You have an oral temperature above 102 F (38.9 C).  You develop abdominal discomfort.  You develop a headache.  You experience dizziness. [place] of Neurological Disorders and Stroke:  [employer] of Rheumatology: www.rheumatology.[facility] of Arthritis and Musculoskeletal and Skin Diseases: www.niams.SouthExposed.es Document Released: 05/26/2002 Document Revised: 08/28/2011 Document Reviewed: 09/16/2009 [facility] Patient Information 2015 [place], [place]. This information is not intended to replace advice given to you by your health care provider. Make sure you discuss any questions you have with your health care provider.  Trigeminal Neuralgia Trigeminal neuralgia is a nerve disorder that causes sudden attacks of severe facial pain. It is caused by damage to the trigeminal nerve, a major nerve in the face. It is more common in women and in the elderly, although it can also happen in younger patients. Attacks last from a few seconds to several minutes and can occur from a couple of times per year to several times per day. Trigeminal neuralgia can be a very distressing and disabling condition. Surgery may be needed in very severe cases if medical treatment does not give relief. HOME CARE INSTRUCTIONS   If your caregiver prescribed medication to help prevent attacks, take as directed.  To help prevent attacks:  Chew on the unaffected side of the mouth.  Avoid touching your face.  Avoid blasts of hot or cold air.  Men may wish to grow a beard to avoid having to shave. SEEK IMMEDIATE MEDICAL CARE IF:  Pain is unbearable and your medicine does not help.  You develop new, unexplained symptoms (problems).  You have problems that may be related to a medication you are taking. Document Released: 06/02/2000 Document Revised: 08/28/2011 Document Reviewed: 04/02/2009 [facility] Patient Information 2015 [place], [place]. This information is not intended to replace advice given to you by your health care provider. Make sure you discuss any questions you have with your health care provider.  Vasculitis Vasculitis is when your blood vessels are inflamed.  There are many different blood vessels in the body, and vasculitis can affect any of them. This includes large (veins and arteries) and small (capillaries) vessels. With vasculitis,   Blood vessel walls can become thick.  Blood vessels can become narrow.  Blood vessels can become weak. Sometimes, it becomes so weak that the blood vessel bulges out like a balloon. This is called an aneurysm. Aneurysms are rare but can be life-threatening.  Scarring can occur.  Not enough blood can flow through the blood vessels. All of these things can damage many parts of the body, including the muscles, kidneys, lungs and brain. There are many types of vasculitis. Some types are short-term (acute), while others are long-term (chronic). Some types may go away without treatment, and others may need to be treated for a long time.  CAUSES  Vasculitis occurs when the body's immune system (which fights germs and disease) makes a mistake. It attacks its own blood vessels. This causes inflammation (the body's way of reacting to injury or infection).   Why this happens is usually not known. The condition is then called primary vasculitis.  Sometimes, something triggers the inflammation. This is called secondary vasculitis. Possible causes include:  Infections.  An immune system disease. Examples include lupus, rheumatoid arthritis and scleroderma.  An allergic reaction to a medicine.  Cancer that affects blood cells. This includes leukemia and lymphoma.  Males and females of all ages and races can develop vasculitis. Some risk factors make vasculitis more likely. These include:  Smoking.  Stress.  Physical injury. SYMPTOMS  There are more than 20 types  of vasculitis. Symptoms of each type vary, but some symptoms are common.  Many people with vasculitis:  Have a fever.  Do not feel like eating.  Lose weight.  Feel very tired.  Have aches and pains.  Feel weak.  Start to not have feeling  (numbness) in an area.  Symptoms for some types of vasculitis also could be:  Sores in the mouth or eyes.  Skin problems. This could be sores, spots or rashes.  Trouble seeing.  Trouble breathing.  Blood in the urine.  Headaches.  Pain in the abdomen.  Stuffy or bloody nose. DIAGNOSIS  Vasculitis symptoms are similar to symptoms of many other conditions. That can make it hard to tell if you have vasculitis. To be sure, your caregiver will ask about your symptoms and do a physical exam. Certain tests may be necessary, such as:   A complete blood count (CBC). This test shows how many red blood cells are in your blood. Not having enough red blood cells (anemic) can result from vasculitis.  Erythrocyte sedimentation (also called sed rate test). It measures inflammation in the body.  C reactive protein (CRP). This also shows if there is inflammation.  Anti-neutrophil cytoplasmic antibodies (ANCA). This can tell if the immune system is reacting to certain cells in the blood.  A urine test. This checks for blood or protein in the urine. That could be a sign of kidney damage from vasculitis.  Imaging tests. These tests create pictures from inside the body. Options include:  X-rays.  Computed tomography (CT) scan. This uses X-rays guided by a computer.  Ultrasound. It creates an image using sound waves.  Magnetic resonance imaging (MRI). It uses radio waves, magnets and a computer.  Angiography. A dye is put into your blood vessels. Then, an X-ray is taken of them.  A biopsy of a blood vessel. This means your caregiver will take out a small piece of a blood vessel. Then, it is checked under a microscope. This is an important test. It often is the best way to know for sure if you have vasculitis. TREATMENT   Treatment will depend on the type of vasculitis and how severe it is. Often, you will need to see a specialist in immunologic diseases (rheumatologist).  Some types of  vasculitis may go away without treatment.  Some types need only over-the-counter drugs.  Prescription medicines are used to treat many types of vasculitis. For example:  Corticosteroids. These are the drugs used most often. They are very powerful. Usually, a high dose is taken until symptoms improve. Then, the dose is gradually decreased. Using corticosteroids for a long time can cause problems. They can make muscles and bones weak. They can cause blood pressure to go up, and cause diabetes. Also, people often gain weight when they take corticosteroids.  Cytotoxic drugs. These kill cells that cause inflammation. Sometimes, they are used if corticosteroids do not help. Other times, both medications are taken.  Surgery. This may be needed to repair a blood vessel that has bulged out (aneurysm).  Treatment can sometimes cure your disease. Other times, it can put the disease in remission (no symptoms). Increased treatment and reevaluation might be necessary if your disease comes back or flares. HOME CARE INSTRUCTIONS   Take any medications that your caregiver prescribes. Follow the directions carefully.  Watch for any problems that can be caused by a drug (side effects). Tell your caregiver right away if you notice any changes or problems.  Keep all  appointments for checkups. This is important to help your caregiver watch for side effects. Checkups may include:  Periodic blood tests.  Bone density testing. This checks how strong or weak your bones are.  Blood pressure checks. If your blood pressure rises, you may need to take a drug to control it while you are taking corticosteroids.  Blood sugar checks. This is to be sure you are not developing diabetes. If you have diabetes, corticosteroid medications may make it worse and require increased treatment.  Exercise. First, talk with your caregiver about what would be OK for you to do. Aerobic exercise (which increases your heart rate) is often  suggested. It includes walking. This type of exercise is good because it helps prevent bone loss. It also helps control your blood pressure.  Follow a healthy diet. Include good sources of protein in your diet. Also include fruits, vegetables and whole grains. Your caregiver can refer you to an expert on healthy eating (dietitian) for more detailed advice.  Learn as much as you can about vasculitis. Understanding your condition can help you cope with it. Coping can be hard because this may be something you will have to live with for years.  Consider joining a support group. It often helps to talk about your worries with others who have the same problems.  Tell your caregiver if you feel stressed, anxious or depressed. Your caregiver may refer you to a specialist, or recommend medication to relieve your symptoms. SEEK MEDICAL CARE IF:   The symptoms that led to your diagnosis return.  You develop worsening fever, fatigue, headache, weight loss or pain in your jaw.  You develop signs of infection. Infections can be worse if you are on corticosteroid medication.  You develop any new or unexplained symptoms of disease. SEEK IMMEDIATE MEDICAL CARE IF:   Your eyesight changes.  Pain does not go away, even after taking medication.  You feel pain in your chest or abdomen.  You have trouble breathing.  One side of your face or body becomes suddenly weak or numb.  Your nose bleeds.  There is blood in your urine.  You develop a fever of more than 102 F (38.9 C). Document Released: 04/01/2009 Document Revised: 08/28/2011 Document Reviewed: 07/30/2013 [facility] Patient Information 2015 [place], [place]. This information is not intended to replace advice given to you by your health care provider. Make sure you discuss any questions you have with your health care provider.

## 2014-06-24 NOTE — ED Notes (Signed)
Pt complains of right facial numbness and head tenderness since earlier tonight

## 2014-06-24 NOTE — ED Notes (Signed)
Patient c/o "pins & needles" to R side of face, R eye swelling, pain to R forehead x2-3 days. "Something isn't right". Patient was seen at [facility] yesterday for same.   Patient has Hx of arthritis.  Patient is speaking in complete sentences.

## 2014-06-24 NOTE — ED Provider Notes (Signed)
CSN: [id number]     Arrival date & time 06/23/14  2243 History   First MD Initiated Contact with Patient 06/23/14 2355     Chief Complaint  Patient presents with  . Eye Pain     (Consider location/radiation/quality/duration/timing/severity/associated sxs/prior Treatment) HPI Anna Carlson is a 54 year old female with past medical history of arthritis, peripheral vascular disease, hypertension who presents the ER complaining of right eye and facial pain for the past 2 days. Patient reports going to her PCP who gave her IM steroids and pain medication prescription which both helped with her facial pain. Patient reports having return of her pain tonight and she came to the emergency room. Patient denies visual change, visual loss dizziness, blurred vision, photophobia or visual disturbance. She states her pain is reproducible in her right for head, and right side of her upper face. Patient denies dizziness, weakness, nausea, vomiting, fever, chest pain, shortness of breath, abdominal pain, dysuria.  Past Medical History  Diagnosis Date  . Arthritis   . Hypertension   . Angina   . PVD (peripheral vascular disease) 11/28/2013  . Bilateral leg pain    Past Surgical History  Procedure Laterality Date  . Arthroscopy knee w/ drilling Right 2008  . Foot surgery Bilateral 2008,2009   No family history on file. History  Substance Use Topics  . Smoking status: Former Research scientist (life sciences)  . Smokeless tobacco: Never Used  . Alcohol Use: Yes   OB History    No data available     Review of Systems  Constitutional: Negative for fever.  HENT: Negative for trouble swallowing.        Facial pain  Eyes: Negative for visual disturbance.  Respiratory: Negative for shortness of breath.   Cardiovascular: Negative for chest pain.  Gastrointestinal: Negative for nausea, vomiting and abdominal pain.  Genitourinary: Negative for dysuria.  Musculoskeletal: Positive for arthralgias. Negative for neck pain.  Skin:  Negative for rash.  Neurological: Negative for dizziness, weakness and numbness.  Psychiatric/Behavioral: Negative.       Allergies  Review of patient's allergies indicates no known allergies.  Home Medications   Prior to Admission medications   Medication Sig Start Date End Date Taking? Authorizing Provider  Adalimumab (HUMIRA) 40 MG/0.8ML PSKT Inject into the skin. Twice monthly    Historical Provider, MD  aspirin 81 MG tablet Take 81 mg by mouth daily.    Historical Provider, MD  Cholecalciferol (VITAMIN D PO) Take 1 tablet by mouth daily.    Historical Provider, MD  ferrous sulfate 325 (65 FE) MG tablet Take 325 mg by mouth daily with breakfast.    Historical Provider, MD  furosemide (LASIX) 20 MG tablet Take 20 mg by mouth daily.    Historical Provider, MD  gabapentin (NEURONTIN) 300 MG capsule Take 300 mg by mouth daily.    Historical Provider, MD  hydroxychloroquine (PLAQUENIL) 200 MG tablet Take 200 mg by mouth 2 (two) times daily.    Historical Provider, MD  ibuprofen (ADVIL,MOTRIN) 800 MG tablet Take 1 tablet (800 mg total) by mouth every 6 (six) hours as needed for moderate pain. 07/07/13   Idalia Needle. Sanford, PA-C  metoprolol (LOPRESSOR) 100 MG tablet Take 100 mg by mouth 2 (two) times daily.    Historical Provider, MD  minocycline (MINOCIN,DYNACIN) 100 MG capsule Take 100 mg by mouth 2 (two) times daily.    Historical Provider, MD  Multiple Vitamin (MULITIVITAMIN WITH MINERALS) TABS Take 1 tablet by mouth daily.    Historical Provider,  MD  predniSONE (DELTASONE) 20 MG tablet Take 3 tablets (60 mg total) by mouth daily. 06/24/14   Carrie Mew, PA-C  sulindac (CLINORIL) 200 MG tablet Take 1 tablet (200 mg total) by mouth 2 (two) times daily. 11/26/12   Shelly Bombard, MD   BP 145/93 mmHg  Pulse 65  Temp(Src) 97.6 F (36.4 C) (Oral)  Resp 18  SpO2 99%  LMP 10/20/2012 Physical Exam  Constitutional: She is oriented to person, place, and time. She appears well-developed  and well-nourished. No distress.  HENT:  Head: Normocephalic and atraumatic.    Mouth/Throat: Oropharynx is clear and moist. No oropharyngeal exudate.  Eyes: EOM and lids are normal. Pupils are equal, round, and reactive to light. Lids are everted and swept, no foreign bodies found. Right eye exhibits no discharge and no exudate. Left eye exhibits no discharge and no exudate. Right conjunctiva is not injected. Right conjunctiva has no hemorrhage. Left conjunctiva is not injected. Left conjunctiva has no hemorrhage. No scleral icterus.  Slit lamp exam:      The right eye shows no corneal abrasion, no corneal flare, no corneal ulcer, no foreign body, no hypopyon, no fluorescein uptake and no anterior chamber bulge.  Neck: Normal range of motion.  Cardiovascular: Normal rate, regular rhythm and normal heart sounds.   No murmur heard. Pulmonary/Chest: Effort normal and breath sounds normal. No respiratory distress.  Abdominal: Soft. There is no tenderness.  Musculoskeletal: Normal range of motion. She exhibits no edema or tenderness.  Neurological: She is alert and oriented to person, place, and time. No cranial nerve deficit. Coordination normal.  Skin: Skin is warm and dry. No rash noted. She is not diaphoretic.  Psychiatric: She has a normal mood and affect.  Nursing note and vitals reviewed.   ED Course  Procedures (including critical care time) Labs Review Labs Reviewed  SEDIMENTATION RATE  C-REACTIVE PROTEIN  ANA  CARDIOLIPIN ANTIBODY    Imaging Review No results found.   EKG Interpretation None      MDM   Final diagnoses:  Facial pain  Malar rash    Right eye and right facial pain 2 days. Patient reports swelling in her right eyelid yesterday, saw her PCP who gave her IM steroids and pain medication prescription. Patient reports temporary relief with these therapies, and increase in her pain today. EOMs intact, no obvious corneal haze or cloudiness, visual acuity  normal for patient . Malar rash noted to patient's face, rate knowledge phenomenon noted to patient's fingers. Concern for possible lupus. No ophthalmologic emergency noted by exam or history tonight. Patient's headache not concerning for [facility] or meningitis. Based on the fact the patient's headache is reproducible, tender to the touch in M1, M2 distribution of the trigeminal nerve, suspect likely trigeminal neuralgia or vasculitis which is attributed to the possible lupus. ESR within normal limits. Doubt GCA without eye abnormalities elevated ESR or visual acuity change. Will follow-up with ESR, CRP, antiphospholipid antibody, ANA. Patient's pain treated here in the ER, patient stating her pain is feeling better after medication. We will give patient a short course of prednisone, and strongly encouraged her to follow up with her primary care physician. Patient states she also has a referral to an ophthalmologist from her PCP, I encouraged her to call again tomorrow to make an appointment. I discussed return precautions with patient, and patient was agreeable to this plan. I encourage her to call or return to the ER should she have any questions or  concerns.  BP 145/93 mmHg  Pulse 65  Temp(Src) 97.6 F (36.4 C) (Oral)  Resp 18  SpO2 99%  LMP 10/20/2012  Signed,  Dahlia Bailiff, PA-C 3:29 AM  Patient was seen and discussed with Dr. Everlene Balls who recommends and agrees the above treatment and plan.    Carrie Mew, PA-C 06/24/14 0330  Everlene Balls, MD 06/24/14 [phone]

## 2014-06-24 NOTE — ED Notes (Signed)
PA Truitt Leep still at bedside speaking with patient.

## 2014-06-24 NOTE — ED Notes (Signed)
Pt started taking humira for rheumatoid for 6 months, she's had a rash reaction the last three times she had the injection. Pt is slow to answer questions and complains of right eye pain

## 2014-06-24 NOTE — ED Notes (Signed)
Upon attempting to go over patient discharge instructions patient began yelling at me and stating she was unhappy with her care. PA Truitt Leep informed, Pa now at bedside speaking with patient.

## 2014-06-25 ENCOUNTER — Emergency Department (HOSPITAL_COMMUNITY): Payer: Medicaid Other

## 2014-06-25 LAB — CBC WITH DIFFERENTIAL/PLATELET
BASOS ABS: 0 10*3/uL (ref 0.0–0.1)
BASOS PCT: 0 % (ref 0–1)
Eosinophils Absolute: 0.1 10*3/uL (ref 0.0–0.7)
Eosinophils Relative: 1 % (ref 0–5)
HEMATOCRIT: 44.8 % (ref 36.0–46.0)
Hemoglobin: 14.6 g/dL (ref 12.0–15.0)
Lymphocytes Relative: 31 % (ref 12–46)
Lymphs Abs: 2.3 10*3/uL (ref 0.7–4.0)
MCH: 29.1 pg (ref 26.0–34.0)
MCHC: 32.6 g/dL (ref 30.0–36.0)
MCV: 89.2 fL (ref 78.0–100.0)
Monocytes Absolute: 0.5 10*3/uL (ref 0.1–1.0)
Monocytes Relative: 7 % (ref 3–12)
NEUTROS PCT: 61 % (ref 43–77)
Neutro Abs: 4.4 10*3/uL (ref 1.7–7.7)
Platelets: 167 10*3/uL (ref 150–400)
RBC: 5.02 MIL/uL (ref 3.87–5.11)
RDW: 14 % (ref 11.5–15.5)
WBC: 7.4 10*3/uL (ref 4.0–10.5)

## 2014-06-25 LAB — COMPREHENSIVE METABOLIC PANEL
ALT: 24 U/L (ref 0–35)
AST: 22 U/L (ref 0–37)
Albumin: 4 g/dL (ref 3.5–5.2)
Alkaline Phosphatase: 78 U/L (ref 39–117)
Anion gap: 6 (ref 5–15)
BILIRUBIN TOTAL: 0.6 mg/dL (ref 0.3–1.2)
BUN: 23 mg/dL (ref 6–23)
CALCIUM: 8.7 mg/dL (ref 8.4–10.5)
CO2: 25 mmol/L (ref 19–32)
Chloride: 107 mEq/L (ref 96–112)
Creatinine, Ser: 0.58 mg/dL (ref 0.50–1.10)
GFR calc non Af Amer: >90 mL/min (ref >90)
GLUCOSE: 106 mg/dL — AB (ref 70–99)
Potassium: 3.5 mmol/L (ref 3.5–5.1)
Sodium: 138 mmol/L (ref 135–145)
Total Protein: 6.9 g/dL (ref 6.0–8.3)

## 2014-06-25 LAB — ANA: ANA: NEGATIVE

## 2014-06-25 LAB — CARDIOLIPIN ANTIBODY: Phospholipids: 267 mg/dL — ABNORMAL HIGH (ref 151–264)

## 2014-06-25 MED ORDER — FENTANYL CITRATE 0.05 MG/ML IJ SOLN
50.0000 ug | INTRAMUSCULAR | Status: DC | PRN
  Administered 2014-06-25: 50 ug via INTRAVENOUS
  Filled 2014-06-25: qty 2

## 2014-06-25 MED ORDER — CARBAMAZEPINE ER 100 MG PO TB12
100.0000 mg | ORAL_TABLET | Freq: Once | ORAL | Status: AC
  Administered 2014-06-25: 100 mg via ORAL
  Filled 2014-06-25: qty 1

## 2014-06-25 MED ORDER — OXYCODONE-ACETAMINOPHEN 5-325 MG PO TABS: 1.0000 | ORAL_TABLET | Freq: Four times a day (QID) | ORAL | Status: DC | PRN

## 2014-06-25 MED ORDER — OXYCODONE-ACETAMINOPHEN 5-325 MG PO TABS
2.0000 | ORAL_TABLET | Freq: Once | ORAL | Status: AC
  Administered 2014-06-25: 2 via ORAL
  Filled 2014-06-25: qty 2

## 2014-06-25 MED ORDER — GADOBENATE DIMEGLUMINE 529 MG/ML IV SOLN
13.0000 mL | Freq: Once | INTRAVENOUS | Status: AC | PRN
  Administered 2014-06-25: 13 mL via INTRAVENOUS

## 2014-06-25 MED ORDER — CARBAMAZEPINE ER 100 MG PO TB12: 100.0000 mg | ORAL_TABLET | Freq: Two times a day (BID) | ORAL | Status: DC

## 2014-06-25 NOTE — ED Notes (Signed)
EDP said patient could eat. Patient given a sandwich, cheese, and apple juice.

## 2014-06-25 NOTE — ED Provider Notes (Signed)
0700 - Care from Dr. Florina Ou. Awaiting MRI. Here with 2 days of right-sided facial pain. Covers entire right face that is constant with intermittent severe headaches lasting 1-2 minutes. She was seen and had labs drawn yesterday which included normal CBC, BMP, ESR, CRP. She reported continued facial pain. MRI was done to rule out any further disease. MRI here is negative. Spoke with Dr. Leonel Ramsay with neurology who stated her symptoms are likely related to trigeminal neuralgia, I agree with this. Started on Tegretol given Percocet. Given neurology follow-up. Stable for discharge. Of note there was some concern with some cognitive slowing last night. Patient was slow to respond but not aphasic. Patient did not have any problems like that with me. She was answering all questions without hesitation.  Evelina Bucy, MD 06/25/14 [phone]

## 2014-06-25 NOTE — ED Notes (Signed)
Patient transported to MRI 

## 2014-06-25 NOTE — Discharge Instructions (Signed)
Trigeminal Neuralgia  Trigeminal neuralgia is a nerve disorder that causes sudden attacks of severe facial pain. It is caused by damage to the trigeminal nerve, a major nerve in the face. It is more common in women and in the elderly, although it can also happen in younger patients. Attacks last from a few seconds to several minutes and can occur from a couple of times per year to several times per day. Trigeminal neuralgia can be a very distressing and disabling condition. Surgery may be needed in very severe cases if medical treatment does not give relief.  HOME CARE INSTRUCTIONS    If your caregiver prescribed medication to help prevent attacks, take as directed.   To help prevent attacks:   Chew on the unaffected side of the mouth.   Avoid touching your face.   Avoid blasts of hot or cold air.   Men may wish to grow a beard to avoid having to shave.  SEEK IMMEDIATE MEDICAL CARE IF:   Pain is unbearable and your medicine does not help.   You develop new, unexplained symptoms (problems).   You have problems that may be related to a medication you are taking.  Document Released: 06/02/2000 Document Revised: 08/28/2011 Document Reviewed: 04/02/2009  Exit[employer] Patient Information 2015 [employer]. This information is not intended to replace advice given to you by your health care provider. Make sure you discuss any questions you have with your health care provider.

## 2014-06-25 NOTE — ED Notes (Signed)
Patient ambulated to the bathroom with very little assistance.

## 2014-09-08 ENCOUNTER — Ambulatory Visit (INDEPENDENT_AMBULATORY_CARE_PROVIDER_SITE_OTHER): Payer: Medicaid Other | Admitting: Obstetrics

## 2014-09-08 ENCOUNTER — Other Ambulatory Visit: Payer: Self-pay | Admitting: Obstetrics

## 2014-09-08 ENCOUNTER — Encounter: Payer: Self-pay | Admitting: Obstetrics

## 2014-09-08 VITALS — Temp 97.8°F | Ht 62.0 in | Wt 139.0 lb

## 2014-09-08 DIAGNOSIS — Z01419 Encounter for gynecological examination (general) (routine) without abnormal findings: Secondary | ICD-10-CM

## 2014-09-08 DIAGNOSIS — Z0000 Encounter for general adult medical examination without abnormal findings: Secondary | ICD-10-CM | POA: Diagnosis not present

## 2014-09-08 DIAGNOSIS — S30824A Blister (nonthermal) of vagina and vulva, initial encounter: Secondary | ICD-10-CM

## 2014-09-08 LAB — POCT URINALYSIS DIPSTICK
Blood, UA: NEGATIVE
GLUCOSE UA: NEGATIVE
KETONES UA: NEGATIVE
LEUKOCYTES UA: NEGATIVE
NITRITE UA: NEGATIVE
Protein, UA: NEGATIVE
Spec Grav, UA: 1.02
pH, UA: 5

## 2014-09-08 NOTE — Progress Notes (Signed)
Patient ID: Anna Carlson, female   DOB: 1960/11/17, 54 y.o.   MRN: [id number]  Chief Complaint  Patient presents with  . Vaginal Bumps    HPI Anna Carlson is a 54 y.o. female.  Bumps on clitoral area, tender.  Has had them for 3-4 days.  Not sexually active for several years.  LPS 2 years ago.  Requests pap.  Last mammogram August 2015. HPI  Past Medical History  Diagnosis Date  . Arthritis   . Hypertension   . Angina   . PVD (peripheral vascular disease) 11/28/2013  . Bilateral leg pain     Past Surgical History  Procedure Laterality Date  . Arthroscopy knee w/ drilling Right 2008  . Foot surgery Bilateral 2008,2009    Family History  Problem Relation Age of Onset  . Diabetes Mother   . Hypertension Mother   . Arthritis Mother   . Cataracts Mother   . Hyperlipidemia Father   . Diabetes Father   . Heart attack Father     Social History History  Substance Use Topics  . Smoking status: Former Research scientist (life sciences)  . Smokeless tobacco: Never Used  . Alcohol Use: No    No Known Allergies  Current Outpatient Prescriptions  Medication Sig Dispense Refill  . Adalimumab (HUMIRA) 40 MG/0.8ML PSKT Inject 40 mg into the skin every 14 (fourteen) days. Twice monthly    . aspirin 81 MG tablet Take 81 mg by mouth daily.    . cholecalciferol (VITAMIN D) 1000 UNITS tablet Take 1,000 Units by mouth daily.  3  . cyanocobalamin 100 MCG tablet Take 100 mcg by mouth daily.    . ferrous sulfate 325 (65 FE) MG tablet Take 325 mg by mouth daily with breakfast.    . furosemide (LASIX) 20 MG tablet Take 20 mg by mouth daily.    Marland Kitchen gabapentin (NEURONTIN) 300 MG capsule Take 300 mg by mouth daily.    Marland Kitchen ibuprofen (ADVIL,MOTRIN) 800 MG tablet Take 1 tablet (800 mg total) by mouth every 6 (six) hours as needed for moderate pain. 30 tablet 0  . metoprolol succinate (TOPROL-XL) 100 MG 24 hr tablet Take 100 mg by mouth daily. Take with or immediately following a meal.    . minocycline  (MINOCIN,DYNACIN) 100 MG capsule Take 100 mg by mouth 2 (two) times daily.    Marland Kitchen omega-3 acid ethyl esters (LOVAZA) 1 G capsule Take 1 g by mouth daily.    Marland Kitchen omeprazole (PRILOSEC) 40 MG capsule Take 40 mg by mouth daily as needed (heartburn).   2  . predniSONE (DELTASONE) 5 MG tablet Take 2.5 mg by mouth daily.   1  . sulindac (CLINORIL) 200 MG tablet Take 1 tablet (200 mg total) by mouth 2 (two) times daily. 60 tablet 11  . thiamine (VITAMIN B-1) 100 MG tablet Take 100 mg by mouth daily.     No current facility-administered medications for this visit.    Review of Systems Review of Systems Constitutional: negative for fatigue and weight loss Respiratory: negative for cough and wheezing Cardiovascular: negative for chest pain, fatigue and palpitations Gastrointestinal: negative for abdominal pain and change in bowel habits Genitourinary: tender clitoral erosions Integument/breast: negative for nipple discharge Musculoskeletal:negative for myalgias Neurological: negative for gait problems and tremors Behavioral/Psych: negative for abusive relationship, depression Endocrine: negative for temperature intolerance     Temperature 97.8 F (36.6 C), height 5\' 2"  (1.575 m), weight 139 lb (63.05 kg), last menstrual period 10/20/2012.  Physical Exam Physical  Exam General:   alert  Skin:   2 small tender erosions on clitoris  Lungs:   clear to auscultation bilaterally  Heart:   regular rate and rhythm, S1, S2 normal, no murmur, click, rub or gallop  Breasts:   normal without suspicious masses, skin or nipple changes or axillary nodes  Abdomen:  normal findings: no organomegaly, soft, non-tender and no hernia  Pelvis:  External genitalia: clitoral erosions, tender Urinary system: urethral meatus normal and bladder without fullness, nontender Vaginal: normal without tenderness, induration or masses Cervix: normal appearance Adnexa: normal bimanual exam Uterus: anteverted and non-tender,  normal size      Data Reviewed Pap history Mammogram history   Assessment     Blisters on clitoris, tender.  Herpes culture done.   Annual exam and pap  Plan     F/U with cultures.  RTC prn  Orders Placed This Encounter  Procedures  . SureSwab, Vaginosis/Vaginitis Plus  . Herpes simplex virus culture  . POCT urinalysis dipstick   No orders of the defined types were placed in this encounter.

## 2014-09-10 LAB — PAP IG AND HPV HIGH-RISK: HPV DNA High Risk: DETECTED — AB

## 2014-09-10 LAB — HERPES SIMPLEX VIRUS CULTURE: Organism ID, Bacteria: NOT DETECTED

## 2014-09-11 LAB — SURESWAB, VAGINOSIS/VAGINITIS PLUS
ATOPOBIUM VAGINAE: NOT DETECTED Log (cells/mL)
C. PARAPSILOSIS, DNA: NOT DETECTED
C. TRACHOMATIS RNA, TMA: NOT DETECTED
C. albicans, DNA: NOT DETECTED
C. glabrata, DNA: NOT DETECTED
C. tropicalis, DNA: NOT DETECTED
Gardnerella vaginalis: NOT DETECTED Log (cells/mL)
LACTOBACILLUS SPECIES: NOT DETECTED Log (cells/mL)
MEGASPHAERA SPECIES: NOT DETECTED Log (cells/mL)
N. gonorrhoeae RNA, TMA: NOT DETECTED
T. vaginalis RNA, QL TMA: NOT DETECTED

## 2014-09-24 ENCOUNTER — Other Ambulatory Visit: Payer: Self-pay | Admitting: Obstetrics

## 2014-09-24 ENCOUNTER — Encounter: Payer: Self-pay | Admitting: Obstetrics

## 2014-09-24 ENCOUNTER — Ambulatory Visit (INDEPENDENT_AMBULATORY_CARE_PROVIDER_SITE_OTHER): Payer: Medicaid Other | Admitting: Obstetrics

## 2014-09-24 VITALS — BP 115/73 | HR 60 | Temp 97.9°F | Ht 63.0 in | Wt 142.0 lb

## 2014-09-24 DIAGNOSIS — R87610 Atypical squamous cells of undetermined significance on cytologic smear of cervix (ASC-US): Secondary | ICD-10-CM

## 2014-09-24 DIAGNOSIS — R87810 Cervical high risk human papillomavirus (HPV) DNA test positive: Secondary | ICD-10-CM | POA: Diagnosis not present

## 2014-09-24 DIAGNOSIS — Z780 Asymptomatic menopausal state: Secondary | ICD-10-CM

## 2014-09-24 NOTE — Addendum Note (Signed)
Addended by: Lewie Loron D on: 09/24/2014 03:30 PM   Modules accepted: Orders

## 2014-09-24 NOTE — Progress Notes (Signed)
Colposcopy Procedure Note  Indications: Pap smear 2 weeks ago showed: ASCUS with POSITIVE high risk HPV. The prior pap showed no abnormalities.  Prior cervical/vaginal disease: normal exam without visible pathology. Prior cervical treatment: no treatment.  Procedure Details  The risks and benefits of the procedure and Written informed consent obtained.  A time-out was performed confirming the patient, procedure and allergy status  Speculum placed in vagina and excellent visualization of cervix achieved, cervix swabbed x 3 with acetic acid solution.  Findings: Cervix: no visible lesions; SCJ visualized 360 degrees without lesions, endocervical curettage performed, cervical biopsies taken at 5, 12, 1  o'clock, specimen labelled and sent to pathology and hemostasis achieved with silver nitrate.   Vaginal inspection: normal without visible lesions. Vulvar colposcopy: vulvar colposcopy not performed.   Physical Exam   Specimens: ECC and cervical biopsies  Complications: none.  Plan: Specimens labelled and sent to Pathology. Will base further treatment on Pathology findings. Post biopsy instructions given to patient. Return to discuss Pathology results in 2 weeks.

## 2014-09-28 ENCOUNTER — Other Ambulatory Visit: Payer: Self-pay | Admitting: Obstetrics

## 2014-09-28 DIAGNOSIS — B9689 Other specified bacterial agents as the cause of diseases classified elsewhere: Secondary | ICD-10-CM

## 2014-09-28 DIAGNOSIS — N760 Acute vaginitis: Principal | ICD-10-CM

## 2014-09-28 LAB — SURESWAB, VAGINOSIS/VAGINITIS PLUS
Atopobium vaginae: NOT DETECTED Log (cells/mL)
C. PARAPSILOSIS, DNA: NOT DETECTED
C. TRACHOMATIS RNA, TMA: NOT DETECTED
C. albicans, DNA: NOT DETECTED
C. glabrata, DNA: NOT DETECTED
C. tropicalis, DNA: NOT DETECTED
Gardnerella vaginalis: NOT DETECTED Log (cells/mL)
LACTOBACILLUS SPECIES: NOT DETECTED Log (cells/mL)
MEGASPHAERA SPECIES: DETECTED Log (cells/mL)
N. gonorrhoeae RNA, TMA: NOT DETECTED
T. VAGINALIS RNA, QL TMA: NOT DETECTED

## 2014-09-28 MED ORDER — TINIDAZOLE 500 MG PO TABS: 1000.0000 mg | ORAL_TABLET | Freq: Every day | ORAL | Status: DC

## 2014-09-30 ENCOUNTER — Telehealth: Payer: Self-pay | Admitting: *Deleted

## 2014-09-30 NOTE — Telephone Encounter (Signed)
Called placed to pt making her aware that her tinidazole has been approved by her insurance. Pt advised to follow up with pharmacy to verify coverage.  Pt advised to contact office if any problems with Rx.

## 2014-10-08 ENCOUNTER — Encounter: Payer: Self-pay | Admitting: Obstetrics

## 2014-10-08 ENCOUNTER — Ambulatory Visit (INDEPENDENT_AMBULATORY_CARE_PROVIDER_SITE_OTHER): Payer: Medicaid Other | Admitting: Obstetrics

## 2014-10-08 VITALS — BP 118/70 | HR 62 | Temp 98.5°F | Wt 140.0 lb

## 2014-10-08 DIAGNOSIS — IMO0002 Reserved for concepts with insufficient information to code with codable children: Secondary | ICD-10-CM

## 2014-10-08 DIAGNOSIS — R896 Abnormal cytological findings in specimens from other organs, systems and tissues: Secondary | ICD-10-CM

## 2014-10-08 NOTE — Patient Instructions (Signed)
Loop Electrosurgical Excision Procedure Loop electrosurgical excision procedure (LEEP) is the removal of a portion of the lower part of the uterus (cervix). The procedure is done when there are significantly abnormal cervical cell changes. Abnormal cell changes of the cervix can lead to cancer if left in place and untreated.  The LEEP procedure itself typically only takes a few minutes. Often, it may be done in your caregiver's office. The procedure is considered safe for those who wish to get pregnant or are trying to get pregnant. Only under rare circumstances should this procedure be done if you are pregnant. LET YOUR CAREGIVER KNOW ABOUT:  Whether you are pregnant or late for your last menstrual period.  Allergies to foods or medicines.  All the medicines you are taking includingherbs, eyedrops, and over-the-counter medicines, and creams.  Use of steroids (by mouth or creams).  Previous problems with anesthetics or numbing medicine.  Previous gynecological surgery.  History of blood clots or bleeding problems.  Any recent or current vaginal infections (herpes, sexually transmitted infections).  Other health problems. RISKS AND COMPLICATIONS  Bleeding.  Infection.  Injury to the vagina, bladder, or rectum.  Very rare obstruction of the cervical opening that causes problems during menstruation (cervical stenosis). BEFORE THE PROCEDURE  Do not take aspirin or blood thinners (anticoagulants) for 1 week before the procedure, or as told by your caregiver.  Eat a light meal before the procedure.  Ask your caregiver about changing or stopping your regular medicines.  You may be given a pain reliever 1 or 2 hours before the procedure. PROCEDURE   A tool (speculum) is placed in the vagina. This allows your caregiver to see the cervix.  An iodine stain is applied to the cervix to find the area of abnormal cells to be removed.  Medicine is injected to numb the cervix (local  anesthetic).   Electricity is passed through a thin wire loop which is then used to remove (cauterize) a small segment of the affected cervix.  Light electrocautery is used to seal any small blood vessels and prevent bleeding.  A paste may be applied to the cauterized area of the cervix to help prevent bleeding.  The tissue sample is sent to the lab. It is examined under the microscope. AFTER THE PROCEDURE  Have someone drive you home.  You may have slight to moderate cramping.  You may notice a black vaginal discharge from the paste used on the cervix to prevent bleeding. This is normal.  Watch for excessive bleeding. This requires immediate medical care.  Ask when your test results will be ready. Make sure you get your test results. Document Released: 08/26/2002 Document Revised: 08/28/2011 Document Reviewed: 11/15/2010 [employer] Patient Information 2015 [employer]. This information is not intended to replace advice given to you by your health care provider. Make sure you discuss any questions you have with your health care provider.  

## 2014-10-08 NOTE — Progress Notes (Signed)
Patient ID: Anna Carlson, female   DOB: 02-22-1961, 54 y.o.   MRN: [id number]  Chief Complaint  Patient presents with  . Follow-up    HPI [name] is a 54 y.o. female.  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  ECC not diagnostic.  Patient presents for results.  HPI  Past Medical History  Diagnosis Date  . Arthritis   . Hypertension   . Angina   . PVD (peripheral vascular disease) 11/28/2013  . Bilateral leg pain     Past Surgical History  Procedure Laterality Date  . Arthroscopy knee w/ drilling Right 2008  . Foot surgery Bilateral 2008,2009    Family History  Problem Relation Age of Onset  . Diabetes Mother   . Hypertension Mother   . Arthritis Mother   . Cataracts Mother   . Hyperlipidemia Father   . Diabetes Father   . Heart attack Father     Social History History  Substance Use Topics  . Smoking status: Former Research scientist (life sciences)  . Smokeless tobacco: Never Used  . Alcohol Use: No    No Known Allergies  Current Outpatient Prescriptions  Medication Sig Dispense Refill  . rosuvastatin (CRESTOR) 10 MG tablet Take 10 mg by mouth daily.    . Adalimumab (HUMIRA) 40 MG/0.8ML PSKT Inject 40 mg into the skin every 14 (fourteen) days. Twice monthly    . aspirin 81 MG tablet Take 81 mg by mouth daily.    . cholecalciferol (VITAMIN D) 1000 UNITS tablet Take 1,000 Units by mouth daily.  3  . cyanocobalamin 100 MCG tablet Take 100 mcg by mouth daily.    . ferrous sulfate 325 (65 FE) MG tablet Take 325 mg by mouth daily with breakfast.    . furosemide (LASIX) 20 MG tablet Take 20 mg by mouth daily.    Marland Kitchen gabapentin (NEURONTIN) 300 MG capsule Take 300 mg by mouth daily.    Marland Kitchen ibuprofen (ADVIL,MOTRIN) 800 MG tablet Take 1 tablet (800 mg total) by mouth every 6 (six) hours as needed for moderate pain. 30 tablet 0  . metoprolol succinate (TOPROL-XL) 100 MG 24 hr tablet Take 100 mg by mouth daily. Take with or immediately following a meal.    . minocycline (MINOCIN,DYNACIN)  100 MG capsule Take 100 mg by mouth 2 (two) times daily.    Marland Kitchen omega-3 acid ethyl esters (LOVAZA) 1 G capsule Take 1 g by mouth daily.    Marland Kitchen omeprazole (PRILOSEC) 40 MG capsule Take 40 mg by mouth daily as needed (heartburn).   2  . predniSONE (DELTASONE) 5 MG tablet Take 2.5 mg by mouth daily.   1  . sulindac (CLINORIL) 200 MG tablet Take 1 tablet (200 mg total) by mouth 2 (two) times daily. 60 tablet 11  . thiamine (VITAMIN B-1) 100 MG tablet Take 100 mg by mouth daily.    Marland Kitchen tinidazole (TINDAMAX) 500 MG tablet Take 2 tablets (1,000 mg total) by mouth daily with breakfast. 10 tablet 2   No current facility-administered medications for this visit.    Review of Systems Review of Systems Constitutional: negative for fatigue and weight loss Respiratory: negative for cough and wheezing Cardiovascular: negative for chest pain, fatigue and palpitations Gastrointestinal: negative for abdominal pain and change in bowel habits Genitourinary:negative Integument/breast: negative for nipple discharge Musculoskeletal:negative for myalgias Neurological: negative for gait problems and tremors Behavioral/Psych: negative for abusive relationship, depression Endocrine: negative for temperature intolerance     Blood pressure 118/70, pulse 62, temperature 98.5 F (  36.9 C), weight 140 lb (63.504 kg), last menstrual period 10/20/2012.  Physical Exam Physical Exam: Deferred  100% of 10 min visit spent on counseling and coordination of care.   Data Reviewed Pathology  Assessment     HGSIL     Plan   LEEP  No orders of the defined types were placed in this encounter.   Meds ordered this encounter  Medications  . rosuvastatin (CRESTOR) 10 MG tablet    Sig: Take 10 mg by mouth daily.

## 2014-11-03 ENCOUNTER — Other Ambulatory Visit: Payer: Self-pay | Admitting: Obstetrics

## 2014-11-03 DIAGNOSIS — Z01818 Encounter for other preprocedural examination: Secondary | ICD-10-CM

## 2014-11-03 MED ORDER — DIAZEPAM 5 MG PO TABS: 5.0000 mg | ORAL_TABLET | Freq: Once | ORAL | Status: DC

## 2014-11-06 ENCOUNTER — Encounter: Payer: Self-pay | Admitting: Obstetrics

## 2014-11-06 ENCOUNTER — Ambulatory Visit (INDEPENDENT_AMBULATORY_CARE_PROVIDER_SITE_OTHER): Payer: Medicaid Other | Admitting: Obstetrics

## 2014-11-06 VITALS — Temp 97.0°F | Ht 62.0 in | Wt 143.0 lb

## 2014-11-06 DIAGNOSIS — G8918 Other acute postprocedural pain: Secondary | ICD-10-CM

## 2014-11-06 DIAGNOSIS — B9689 Other specified bacterial agents as the cause of diseases classified elsewhere: Secondary | ICD-10-CM

## 2014-11-06 DIAGNOSIS — F419 Anxiety disorder, unspecified: Secondary | ICD-10-CM

## 2014-11-06 DIAGNOSIS — N760 Acute vaginitis: Secondary | ICD-10-CM

## 2014-11-06 DIAGNOSIS — R896 Abnormal cytological findings in specimens from other organs, systems and tissues: Secondary | ICD-10-CM | POA: Diagnosis not present

## 2014-11-06 DIAGNOSIS — IMO0002 Reserved for concepts with insufficient information to code with codable children: Secondary | ICD-10-CM

## 2014-11-06 MED ORDER — IBUPROFEN 800 MG PO TABS: 800.0000 mg | ORAL_TABLET | Freq: Three times a day (TID) | ORAL | Status: DC | PRN

## 2014-11-06 MED ORDER — TINIDAZOLE 500 MG PO TABS
1000.0000 mg | ORAL_TABLET | Freq: Every day | ORAL | Status: DC
Start: 2014-11-06 — End: 2015-02-07

## 2014-11-06 MED ORDER — DIAZEPAM 5 MG PO TABS: 5.0000 mg | ORAL_TABLET | Freq: Two times a day (BID) | ORAL | Status: DC | PRN

## 2014-11-06 NOTE — Progress Notes (Signed)
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  KY jelly was applied to the cryoprobe.  The probe was then applied to the cervix until an ice ball had formed.  This ice ball was then allowed to remain in place a three min.  The probe was removed and the cervix was allowed to thaw for 5 minutes.  The probe was then applied again for 3 minutes.   INSTRUCTIONS: The cryoprobe was then removed.  The patient was given a preprinted instruction list and is instructed to return to the office in 2 weeks for a follow up examination.

## 2014-11-19 ENCOUNTER — Encounter: Payer: Self-pay | Admitting: Obstetrics

## 2014-11-19 ENCOUNTER — Ambulatory Visit (INDEPENDENT_AMBULATORY_CARE_PROVIDER_SITE_OTHER): Payer: Medicaid Other | Admitting: Obstetrics

## 2014-11-19 VITALS — BP 128/82 | HR 64 | Temp 98.2°F

## 2014-11-19 DIAGNOSIS — R896 Abnormal cytological findings in specimens from other organs, systems and tissues: Secondary | ICD-10-CM

## 2014-11-19 DIAGNOSIS — A499 Bacterial infection, unspecified: Secondary | ICD-10-CM | POA: Diagnosis not present

## 2014-11-19 DIAGNOSIS — N760 Acute vaginitis: Secondary | ICD-10-CM

## 2014-11-19 DIAGNOSIS — B9689 Other specified bacterial agents as the cause of diseases classified elsewhere: Secondary | ICD-10-CM

## 2014-11-19 DIAGNOSIS — IMO0002 Reserved for concepts with insufficient information to code with codable children: Secondary | ICD-10-CM

## 2014-11-19 MED ORDER — CLINDAMYCIN HCL 300 MG PO CAPS: 300.0000 mg | ORAL_CAPSULE | Freq: Three times a day (TID) | ORAL | Status: DC

## 2014-11-19 NOTE — Progress Notes (Signed)
Patient ID: Anna Carlson, female   DOB: Jul 03, 1960, 54 y.o.   MRN: [id number]  Chief Complaint  Patient presents with  . Follow-up    HPI Anna Carlson is a 54 y.o. female.  S/P cryocautery of cervix for HGSIL.  HPI  Past Medical History  Diagnosis Date  . Arthritis   . Hypertension   . Angina   . PVD (peripheral vascular disease) 11/28/2013  . Bilateral leg pain     Past Surgical History  Procedure Laterality Date  . Arthroscopy knee w/ drilling Right 2008  . Foot surgery Bilateral 2008,2009    Family History  Problem Relation Age of Onset  . Diabetes Mother   . Hypertension Mother   . Arthritis Mother   . Cataracts Mother   . Hyperlipidemia Father   . Diabetes Father   . Heart attack Father     Social History History  Substance Use Topics  . Smoking status: Former Research scientist (life sciences)  . Smokeless tobacco: Never Used  . Alcohol Use: No    No Known Allergies  Current Outpatient Prescriptions  Medication Sig Dispense Refill  . Adalimumab (HUMIRA) 40 MG/0.8ML PSKT Inject 40 mg into the skin every 14 (fourteen) days. Twice monthly    . aspirin 81 MG tablet Take 81 mg by mouth daily.    . cholecalciferol (VITAMIN D) 1000 UNITS tablet Take 1,000 Units by mouth daily.  3  . clindamycin (CLEOCIN) 300 MG capsule Take 1 capsule (300 mg total) by mouth 3 (three) times daily. 30 capsule 0  . cyanocobalamin 100 MCG tablet Take 100 mcg by mouth daily.    . diazepam (VALIUM) 5 MG tablet Take 1 tablet (5 mg total) by mouth every 12 (twelve) hours as needed for anxiety. 12 tablet 0  . ferrous sulfate 325 (65 FE) MG tablet Take 325 mg by mouth daily with breakfast.    . furosemide (LASIX) 20 MG tablet Take 20 mg by mouth daily.    Marland Kitchen gabapentin (NEURONTIN) 300 MG capsule Take 300 mg by mouth daily.    Marland Kitchen ibuprofen (ADVIL,MOTRIN) 800 MG tablet Take 1 tablet (800 mg total) by mouth every 8 (eight) hours as needed. 30 tablet 5  . metoprolol succinate (TOPROL-XL) 100 MG 24 hr tablet  Take 100 mg by mouth daily. Take with or immediately following a meal.    . minocycline (MINOCIN,DYNACIN) 100 MG capsule Take 100 mg by mouth 2 (two) times daily.    Marland Kitchen omega-3 acid ethyl esters (LOVAZA) 1 G capsule Take 1 g by mouth daily.    Marland Kitchen omeprazole (PRILOSEC) 40 MG capsule Take 40 mg by mouth daily as needed (heartburn).   2  . predniSONE (DELTASONE) 5 MG tablet Take 2.5 mg by mouth daily.   1  . rosuvastatin (CRESTOR) 10 MG tablet Take 10 mg by mouth daily.    . sulindac (CLINORIL) 200 MG tablet Take 1 tablet (200 mg total) by mouth 2 (two) times daily. 60 tablet 11  . thiamine (VITAMIN B-1) 100 MG tablet Take 100 mg by mouth daily.    Marland Kitchen tinidazole (TINDAMAX) 500 MG tablet Take 2 tablets (1,000 mg total) by mouth daily with breakfast. 10 tablet 2   No current facility-administered medications for this visit.    Review of Systems Review of Systems Constitutional: negative for fatigue and weight loss Respiratory: negative for cough and wheezing Cardiovascular: negative for chest pain, fatigue and palpitations Gastrointestinal: negative for abdominal pain and change in bowel habits Genitourinary:negative Integument/breast:  negative for nipple discharge Musculoskeletal:negative for myalgias Neurological: negative for gait problems and tremors Behavioral/Psych: negative for abusive relationship, depression Endocrine: negative for temperature intolerance     Blood pressure 128/82, pulse 64, temperature 98.2 F (36.8 C), last menstrual period 10/20/2012.  Physical Exam Physical Exam Abdomen:  normal findings: no organomegaly, soft, non-tender and no hernia  Pelvis:  External genitalia: normal general appearance Urinary system: urethral meatus normal and bladder without fullness, nontender Vaginal: normal without tenderness, induration or masses Cervix: normal appearance       Data Reviewed Pap  Assessment     HGSIL.  S/P Cryocautery of cervix. 2 week post op check.   Doing well.     Plan    Repeat pap q 4 months. Clindamycin Rx for BV prophylaxis.   No orders of the defined types were placed in this encounter.   Meds ordered this encounter  Medications  . clindamycin (CLEOCIN) 300 MG capsule    Sig: Take 1 capsule (300 mg total) by mouth 3 (three) times daily.    Dispense:  30 capsule    Refill:  0

## 2015-02-06 ENCOUNTER — Emergency Department (HOSPITAL_COMMUNITY)
Admission: EM | Admit: 2015-02-06 | Discharge: 2015-02-07 | Disposition: A | Discharge status: Home / Self Care | Payer: Medicaid Other | Attending: Emergency Medicine | Admitting: Emergency Medicine

## 2015-02-06 ENCOUNTER — Encounter (HOSPITAL_COMMUNITY): Payer: Self-pay

## 2015-02-06 DIAGNOSIS — Z79899 Other long term (current) drug therapy: Secondary | ICD-10-CM | POA: Diagnosis not present

## 2015-02-06 DIAGNOSIS — R1031 Right lower quadrant pain: Secondary | ICD-10-CM | POA: Diagnosis not present

## 2015-02-06 DIAGNOSIS — M1990 Unspecified osteoarthritis, unspecified site: Secondary | ICD-10-CM | POA: Insufficient documentation

## 2015-02-06 DIAGNOSIS — I10 Essential (primary) hypertension: Secondary | ICD-10-CM | POA: Insufficient documentation

## 2015-02-06 DIAGNOSIS — Z7982 Long term (current) use of aspirin: Secondary | ICD-10-CM | POA: Insufficient documentation

## 2015-02-06 DIAGNOSIS — R300 Dysuria: Secondary | ICD-10-CM | POA: Insufficient documentation

## 2015-02-06 DIAGNOSIS — Z87891 Personal history of nicotine dependence: Secondary | ICD-10-CM | POA: Diagnosis not present

## 2015-02-06 DIAGNOSIS — I209 Angina pectoris, unspecified: Secondary | ICD-10-CM | POA: Diagnosis not present

## 2015-02-06 DIAGNOSIS — Z792 Long term (current) use of antibiotics: Secondary | ICD-10-CM | POA: Diagnosis not present

## 2015-02-06 DIAGNOSIS — M545 Low back pain: Secondary | ICD-10-CM | POA: Diagnosis present

## 2015-02-06 LAB — URINALYSIS, ROUTINE W REFLEX MICROSCOPIC
BILIRUBIN URINE: NEGATIVE
Glucose, UA: NEGATIVE mg/dL
Hgb urine dipstick: NEGATIVE
KETONES UR: NEGATIVE mg/dL
Leukocytes, UA: NEGATIVE
NITRITE: NEGATIVE
PH: 5.5 (ref 5.0–8.0)
Protein, ur: NEGATIVE mg/dL
Specific Gravity, Urine: 1.017 (ref 1.005–1.030)
Urobilinogen, UA: 0.2 mg/dL (ref 0.0–1.0)

## 2015-02-06 MED ORDER — SODIUM CHLORIDE 0.9 % IV BOLUS (SEPSIS)
1000.0000 mL | Freq: Once | INTRAVENOUS | Status: AC
  Administered 2015-02-07: 1000 mL via INTRAVENOUS

## 2015-02-06 MED ORDER — MORPHINE SULFATE (PF) 4 MG/ML IV SOLN
4.0000 mg | Freq: Once | INTRAVENOUS | Status: AC
  Administered 2015-02-07: 4 mg via INTRAVENOUS
  Filled 2015-02-06: qty 1

## 2015-02-06 NOTE — ED Notes (Signed)
Pt states she is having back pain and went to her MD a few days ago b/c her urine was orange, cloudy and had a odor. Had a UTI. Given an antibiotic and finished it. Urine is still cloudy and having lower abd pain now.

## 2015-02-06 NOTE — ED Provider Notes (Signed)
History  This chart was scribed for Everlene Balls, MD by Marlowe Kays, ED Scribe. This patient was seen in room A08C/A08C and the patient's care was started at 11:39 PM.  Chief Complaint  Patient presents with  . Back Pain  . Dysuria   The history is provided by the patient and medical records. No language interpreter was used.    HPI Comments:  Anna Carlson is a 54 y.o. female who presents to the Emergency Department complaining of severe lower back pain that began 5-6 days ago. She reports associated lower abdominal pain and nausea. She reports mild diarrhea that is normal at baseline and orange urine. She reports seeing her doctor earlier this week and was prescribed three days of antibiotics for treatment of a kidney infection that help relieve her symptoms slightly. She has not done anything else to treat her symptoms. She denies modifying factors. She denies vaginal bleeding, vaginal discharge, vomiting, fever, chills and dysuria.She reports h/o nephrolithiasis.  Past Medical History  Diagnosis Date  . Arthritis   . Hypertension   . Angina   . PVD (peripheral vascular disease) 11/28/2013  . Bilateral leg pain    Past Surgical History  Procedure Laterality Date  . Arthroscopy knee w/ drilling Right 2008  . Foot surgery Bilateral 2008,2009   Family History  Problem Relation Age of Onset  . Diabetes Mother   . Hypertension Mother   . Arthritis Mother   . Cataracts Mother   . Hyperlipidemia Father   . Diabetes Father   . Heart attack Father    Social History  Substance Use Topics  . Smoking status: Former Research scientist (life sciences)  . Smokeless tobacco: Never Used  . Alcohol Use: No   OB History    No data available     Review of Systems 10 Systems reviewed and are negative for acute change except as noted in the HPI.  Allergies  Review of patient's allergies indicates no known allergies.  Home Medications   Prior to Admission medications   Medication Sig Start Date End  Date Taking? Authorizing Provider  Adalimumab (HUMIRA) 40 MG/0.8ML PSKT Inject 40 mg into the skin every 14 (fourteen) days. Twice monthly    Historical Provider, MD  aspirin 81 MG tablet Take 81 mg by mouth daily.    Historical Provider, MD  cholecalciferol (VITAMIN D) 1000 UNITS tablet Take 1,000 Units by mouth daily. 05/05/14   Historical Provider, MD  clindamycin (CLEOCIN) 300 MG capsule Take 1 capsule (300 mg total) by mouth 3 (three) times daily. 11/19/14   Shelly Bombard, MD  cyanocobalamin 100 MCG tablet Take 100 mcg by mouth daily.    Historical Provider, MD  diazepam (VALIUM) 5 MG tablet Take 1 tablet (5 mg total) by mouth every 12 (twelve) hours as needed for anxiety. 11/06/14   Shelly Bombard, MD  ferrous sulfate 325 (65 FE) MG tablet Take 325 mg by mouth daily with breakfast.    Historical Provider, MD  furosemide (LASIX) 20 MG tablet Take 20 mg by mouth daily.    Historical Provider, MD  gabapentin (NEURONTIN) 300 MG capsule Take 300 mg by mouth daily.    Historical Provider, MD  ibuprofen (ADVIL,MOTRIN) 800 MG tablet Take 1 tablet (800 mg total) by mouth every 8 (eight) hours as needed. 11/06/14   Shelly Bombard, MD  metoprolol succinate (TOPROL-XL) 100 MG 24 hr tablet Take 100 mg by mouth daily. Take with or immediately following a meal.    Historical  Provider, MD  minocycline (MINOCIN,DYNACIN) 100 MG capsule Take 100 mg by mouth 2 (two) times daily.    Historical Provider, MD  omega-3 acid ethyl esters (LOVAZA) 1 G capsule Take 1 g by mouth daily.    Historical Provider, MD  omeprazole (PRILOSEC) 40 MG capsule Take 40 mg by mouth daily as needed (heartburn).  05/27/14   Historical Provider, MD  predniSONE (DELTASONE) 5 MG tablet Take 2.5 mg by mouth daily.  05/27/14   Historical Provider, MD  rosuvastatin (CRESTOR) 10 MG tablet Take 10 mg by mouth daily.    Historical Provider, MD  sulindac (CLINORIL) 200 MG tablet Take 1 tablet (200 mg total) by mouth 2 (two) times daily. 11/26/12    Shelly Bombard, MD  thiamine (VITAMIN B-1) 100 MG tablet Take 100 mg by mouth daily.    Historical Provider, MD  tinidazole (TINDAMAX) 500 MG tablet Take 2 tablets (1,000 mg total) by mouth daily with breakfast. 11/06/14   Shelly Bombard, MD   Triage Vitals: BP 123/71 mmHg  Pulse 70  Temp(Src) 97.7 F (36.5 C) (Oral)  Resp 16  Ht 5\' 2"  (1.575 m)  Wt 146 lb (66.225 kg)  BMI 26.70 kg/m2  SpO2 99%  LMP 10/20/2012 Physical Exam  Constitutional: She is oriented to person, place, and time. She appears well-developed and well-nourished. No distress.  HENT:  Head: Normocephalic and atraumatic.  Nose: Nose normal.  Mouth/Throat: Oropharynx is clear and moist. No oropharyngeal exudate.  Eyes: Conjunctivae and EOM are normal. Pupils are equal, round, and reactive to light. No scleral icterus.  Neck: Normal range of motion. Neck supple. No JVD present. No tracheal deviation present. No thyromegaly present.  Cardiovascular: Normal rate, regular rhythm and normal heart sounds.  Exam reveals no gallop and no friction rub.   No murmur heard. Pulmonary/Chest: Effort normal and breath sounds normal. No respiratory distress. She has no wheezes. She exhibits no tenderness.  Abdominal: Soft. Bowel sounds are normal. She exhibits no distension and no mass. There is tenderness in the right lower quadrant. There is no rebound and no guarding.  Genitourinary:  Physiologic discharge  Musculoskeletal: Normal range of motion. She exhibits no edema or tenderness.  Lymphadenopathy:    She has no cervical adenopathy.  Neurological: She is alert and oriented to person, place, and time. No cranial nerve deficit. She exhibits normal muscle tone.  Skin: Skin is warm and dry. No rash noted. No erythema. No pallor.  Nursing note and vitals reviewed.   ED Course  Procedures (including critical care time) DIAGNOSTIC STUDIES: Oxygen Saturation is 99% on RA, normal by my interpretation.   COORDINATION OF  CARE: 11:45 PM- Will perform pelvic exam. Pt verbalizes understanding and agrees to plan.  Medications - No data to display  Labs Review Labs Reviewed  WET PREP, GENITAL - Abnormal; Notable for the following:    Clue Cells Wet Prep HPF POC FEW (*)    WBC, Wet Prep HPF POC MANY (*)    All other components within normal limits  COMPREHENSIVE METABOLIC PANEL - Abnormal; Notable for the following:    Total Protein 5.8 (*)    Albumin 3.4 (*)    All other components within normal limits  I-STAT BETA HCG BLOOD, ED ([facility], [facility], [facility] ONLY) - Abnormal; Notable for the following:    I-stat hCG, quantitative 5.9 (*)    All other components within normal limits  URINALYSIS, ROUTINE W REFLEX MICROSCOPIC (NOT AT [facility])  CBC WITH DIFFERENTIAL/PLATELET  LIPASE, BLOOD  GC/CHLAMYDIA PROBE AMP ([place]) NOT AT [place]    Imaging Review Ct Abdomen Pelvis W Contrast  02/07/2015   CLINICAL DATA:  Right lower quadrant abdominal pain. Evaluate for appendicitis.  EXAM: CT ABDOMEN AND PELVIS WITH CONTRAST  TECHNIQUE: Multidetector CT imaging of the abdomen and pelvis was performed using the standard protocol following bolus administration of intravenous contrast.  CONTRAST:  100 cc Omnipaque 300 intravenous  COMPARISON:  02/14/2007  FINDINGS: BODY WALL: No contributory findings.  LOWER CHEST: No contributory findings.  ABDOMEN/PELVIS:  Liver: No focal abnormality.  Biliary: Cholecystectomy with normal common bile duct diameter.  Pancreas: Unremarkable.  Spleen: Unremarkable.  Adrenals: Unremarkable.  Kidneys and ureters: No hydronephrosis or stone.  Bladder: Unremarkable.  Reproductive: No pathologic findings.  Bowel: No obstruction. No inflammatory bowel wall thickening. Appendix not identified but no secondary signs of appendicitis.  Retroperitoneum: No mass or adenopathy.  Peritoneum: No ascites or pneumoperitoneum.  Vascular: No acute abnormality.  OSSEOUS: No acute abnormalities. Focal left L4-5 facet arthropathy with  overgrowth.  IMPRESSION: No acute finding. The appendix is not identified but no secondary signs of appendicitis.   Electronically Signed   By: Monte Fantasia M.D.   On: 02/07/2015 03:15   I have personally reviewed and evaluated these images and lab results as part of my medical decision-making.   EKG Interpretation None      MDM   Final diagnoses:  None    Patient presents emergency department for abdominal pain in her right lower quadrant. She was recently treated for UTI. Urinalysis today is negative. HCG is elevated however she states she has not had sexual intercourse in one year. Will proceed with CT scan. She was given morphine for pain control. Upon repeat evaluation she states her pain has improved. CT scan is negative for appendicitis and does not show any hydronephrosis. She has no CVA tenderness on exam. I do not know what is causing her right lower quadrant pain at this time. Return precautions given. She is advised to see her primary care physician within 3 days for close follow-up. She appears well and in no acute distress. Vital signs remain within her normal limits and she is safe for discharge.  I personally performed the services described in this documentation, which was scribed in my presence. The recorded information has been reviewed and is accurate.    Everlene Balls, MD 02/07/15 [id number]

## 2015-02-07 ENCOUNTER — Emergency Department (HOSPITAL_COMMUNITY): Payer: Medicaid Other

## 2015-02-07 ENCOUNTER — Encounter (HOSPITAL_COMMUNITY): Payer: Self-pay | Admitting: Radiology

## 2015-02-07 LAB — CBC WITH DIFFERENTIAL/PLATELET
BASOS PCT: 0 % (ref 0–1)
Basophils Absolute: 0 10*3/uL (ref 0.0–0.1)
EOS ABS: 0.1 10*3/uL (ref 0.0–0.7)
Eosinophils Relative: 2 % (ref 0–5)
HCT: 38.8 % (ref 36.0–46.0)
HEMOGLOBIN: 12.8 g/dL (ref 12.0–15.0)
Lymphocytes Relative: 32 % (ref 12–46)
Lymphs Abs: 1.9 10*3/uL (ref 0.7–4.0)
MCH: 29.3 pg (ref 26.0–34.0)
MCHC: 33 g/dL (ref 30.0–36.0)
MCV: 88.8 fL (ref 78.0–100.0)
Monocytes Absolute: 0.5 10*3/uL (ref 0.1–1.0)
Monocytes Relative: 8 % (ref 3–12)
Neutro Abs: 3.4 10*3/uL (ref 1.7–7.7)
Neutrophils Relative %: 58 % (ref 43–77)
PLATELETS: 155 10*3/uL (ref 150–400)
RBC: 4.37 MIL/uL (ref 3.87–5.11)
RDW: 13.8 % (ref 11.5–15.5)
WBC: 5.9 10*3/uL (ref 4.0–10.5)

## 2015-02-07 LAB — COMPREHENSIVE METABOLIC PANEL
ALBUMIN: 3.4 g/dL — AB (ref 3.5–5.0)
ALK PHOS: 53 U/L (ref 38–126)
ALT: 25 U/L (ref 14–54)
AST: 23 U/L (ref 15–41)
Anion gap: 9 (ref 5–15)
BUN: 15 mg/dL (ref 6–20)
CHLORIDE: 108 mmol/L (ref 101–111)
CO2: 26 mmol/L (ref 22–32)
Calcium: 8.9 mg/dL (ref 8.9–10.3)
Creatinine, Ser: 0.61 mg/dL (ref 0.44–1.00)
GFR calc Af Amer: >60 mL/min (ref >60)
GFR calc non Af Amer: >60 mL/min (ref >60)
GLUCOSE: 94 mg/dL (ref 65–99)
Potassium: 3.7 mmol/L (ref 3.5–5.1)
SODIUM: 143 mmol/L (ref 135–145)
Total Bilirubin: 0.5 mg/dL (ref 0.3–1.2)
Total Protein: 5.8 g/dL — ABNORMAL LOW (ref 6.5–8.1)

## 2015-02-07 LAB — WET PREP, GENITAL
TRICH WET PREP: NONE SEEN
YEAST WET PREP: NONE SEEN

## 2015-02-07 LAB — I-STAT BETA HCG BLOOD, ED (MC, WL, AP ONLY): I-stat hCG, quantitative: 5.9 m[IU]/mL — ABNORMAL HIGH (ref <5)

## 2015-02-07 LAB — LIPASE, BLOOD: Lipase: 49 U/L (ref 22–51)

## 2015-02-07 MED ORDER — IOHEXOL 300 MG/ML  SOLN
25.0000 mL | Freq: Once | INTRAMUSCULAR | Status: AC | PRN
  Administered 2015-02-07: 25 mL via ORAL

## 2015-02-07 MED ORDER — IOHEXOL 300 MG/ML  SOLN
100.0000 mL | Freq: Once | INTRAMUSCULAR | Status: AC | PRN
  Administered 2015-02-07: 100 mL via INTRAVENOUS

## 2015-02-07 NOTE — Discharge Instructions (Signed)
Abdominal Pain, Women Anna Carlson, your CT scan today did not show a cause for your abdominal pain. Continue to take Tylenol as needed for pain and see a primary care physician within 3 days for close follow-up. If symptoms worsen come back to the emergency department immediately. Thank you. Abdominal (stomach, pelvic, or belly) pain can be caused by many things. It is important to tell your doctor:  The location of the pain.  Does it come and go or is it present all the time?  Are there things that start the pain (eating certain foods, exercise)?  Are there other symptoms associated with the pain (fever, nausea, vomiting, diarrhea)? All of this is helpful to know when trying to find the cause of the pain. CAUSES   Stomach: virus or bacteria infection, or ulcer.  Intestine: appendicitis (inflamed appendix), regional ileitis (Crohn's disease), ulcerative colitis (inflamed colon), irritable bowel syndrome, diverticulitis (inflamed diverticulum of the colon), or cancer of the stomach or intestine.  Gallbladder disease or stones in the gallbladder.  Kidney disease, kidney stones, or infection.  Pancreas infection or cancer.  Fibromyalgia (pain disorder).  Diseases of the female organs:  Uterus: fibroid (non-cancerous) tumors or infection.  Fallopian tubes: infection or tubal pregnancy.  Ovary: cysts or tumors.  Pelvic adhesions (scar tissue).  Endometriosis (uterus lining tissue growing in the pelvis and on the pelvic organs).  Pelvic congestion syndrome (female organs filling up with blood just before the menstrual period).  Pain with the menstrual period.  Pain with ovulation (producing an egg).  Pain with an IUD (intrauterine device, birth control) in the uterus.  Cancer of the female organs.  Functional pain (pain not caused by a disease, may improve without treatment).  Psychological pain.  Depression. DIAGNOSIS  Your doctor will decide the seriousness of your  pain by doing an examination.  Blood tests.  X-rays.  Ultrasound.  CT scan (computed tomography, special type of X-ray).  MRI (magnetic resonance imaging).  Cultures, for infection.  Barium enema (dye inserted in the large intestine, to better view it with X-rays).  Colonoscopy (looking in intestine with a lighted tube).  Laparoscopy (minor surgery, looking in abdomen with a lighted tube).  Major abdominal exploratory surgery (looking in abdomen with a large incision). TREATMENT  The treatment will depend on the cause of the pain.   Many cases can be observed and treated at home.  Over-the-counter medicines recommended by your caregiver.  Prescription medicine.  Antibiotics, for infection.  Birth control pills, for painful periods or for ovulation pain.  Hormone treatment, for endometriosis.  Nerve blocking injections.  Physical therapy.  Antidepressants.  Counseling with a psychologist or psychiatrist.  Minor or major surgery. HOME CARE INSTRUCTIONS   Do not take laxatives, unless directed by your caregiver.  Take over-the-counter pain medicine only if ordered by your caregiver. Do not take aspirin because it can cause an upset stomach or bleeding.  Try a clear liquid diet (broth or water) as ordered by your caregiver. Slowly move to a bland diet, as tolerated, if the pain is related to the stomach or intestine.  Have a thermometer and take your temperature several times a day, and record it.  Bed rest and sleep, if it helps the pain.  Avoid sexual intercourse, if it causes pain.  Avoid stressful situations.  Keep your follow-up appointments and tests, as your caregiver orders.  If the pain does not go away with medicine or surgery, you may try:  Acupuncture.  Relaxation exercises (  yoga, meditation).  Group therapy.  Counseling. SEEK MEDICAL CARE IF:   You notice certain foods cause stomach pain.  Your home care treatment is not helping  your pain.  You need stronger pain medicine.  You want your IUD removed.  You feel faint or lightheaded.  You develop nausea and vomiting.  You develop a rash.  You are having side effects or an allergy to your medicine. SEEK IMMEDIATE MEDICAL CARE IF:   Your pain does not go away or gets worse.  You have a fever.  Your pain is felt only in portions of the abdomen. The right side could possibly be appendicitis. The left lower portion of the abdomen could be colitis or diverticulitis.  You are passing blood in your stools (bright red or black tarry stools, with or without vomiting).  You have blood in your urine.  You develop chills, with or without a fever.  You pass out. MAKE SURE YOU:   Understand these instructions.  Will watch your condition.  Will get help right away if you are not doing well or get worse. Document Released: 04/02/2007 Document Revised: 10/20/2013 Document Reviewed: 04/22/2009 [facility] Patient Information 2015 [place], [place]. This information is not intended to replace advice given to you by your health care provider. Make sure you discuss any questions you have with your health care provider.

## 2015-02-07 NOTE — ED Notes (Signed)
Dr Claudine Mouton in room with Jerene Pitch RN as chaperone to complete pelvic examination

## 2015-02-07 NOTE — ED Notes (Signed)
Pt is now done drinking her contrast.

## 2015-02-07 NOTE — ED Notes (Signed)
Pt states "I have no had sexual intercourse in a year" after hcg blood draw results.

## 2015-02-08 LAB — GC/CHLAMYDIA PROBE AMP (~~LOC~~) NOT AT ARMC
Chlamydia: NEGATIVE
Neisseria Gonorrhea: NEGATIVE

## 2015-03-23 ENCOUNTER — Ambulatory Visit (INDEPENDENT_AMBULATORY_CARE_PROVIDER_SITE_OTHER): Payer: Medicaid Other | Admitting: Obstetrics

## 2015-03-23 ENCOUNTER — Encounter: Payer: Self-pay | Admitting: Obstetrics

## 2015-03-23 VITALS — BP 125/81 | HR 63 | Temp 98.1°F | Wt 139.0 lb

## 2015-03-23 DIAGNOSIS — Z8742 Personal history of other diseases of the female genital tract: Secondary | ICD-10-CM

## 2015-03-23 DIAGNOSIS — R896 Abnormal cytological findings in specimens from other organs, systems and tissues: Secondary | ICD-10-CM

## 2015-03-23 DIAGNOSIS — Z23 Encounter for immunization: Secondary | ICD-10-CM

## 2015-03-23 DIAGNOSIS — IMO0002 Reserved for concepts with insufficient information to code with codable children: Secondary | ICD-10-CM

## 2015-03-23 DIAGNOSIS — Z8619 Personal history of other infectious and parasitic diseases: Secondary | ICD-10-CM

## 2015-03-23 NOTE — Progress Notes (Signed)
Patient ID: Anna Carlson, female   DOB: 1960/07/14, 54 y.o.   MRN: [id number]  Chief Complaint  Patient presents with  . Follow-up    Repeat Pap    HPI Anna Carlson is a 54 y.o. female.  H/O HGSIL pap smear.  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  Cryocautery done 4 months ago.  Presents today for 1st pap smear after Cryocautery, per Dysplasia Protocol.  No complaints.   HPI  Past Medical History  Diagnosis Date  . Arthritis   . Hypertension   . Angina   . PVD (peripheral vascular disease) ([place]) 11/28/2013  . Bilateral leg pain     Past Surgical History  Procedure Laterality Date  . Arthroscopy knee w/ drilling Right 2008  . Foot surgery Bilateral 2008,2009    Family History  Problem Relation Age of Onset  . Diabetes Mother   . Hypertension Mother   . Arthritis Mother   . Cataracts Mother   . Hyperlipidemia Father   . Diabetes Father   . Heart attack Father     Social History Social History  Substance Use Topics  . Smoking status: Former Research scientist (life sciences)  . Smokeless tobacco: Never Used  . Alcohol Use: No    No Known Allergies  Current Outpatient Prescriptions  Medication Sig Dispense Refill  . albuterol (PROAIR HFA) 108 (90 BASE) MCG/ACT inhaler Inhale 1-2 puffs into the lungs every 6 (six) hours as needed for wheezing or shortness of breath.    Marland Kitchen b complex vitamins tablet Take 1 tablet by mouth daily.    . cholecalciferol (VITAMIN D) 1000 UNITS tablet Take 1,000 Units by mouth daily.  3  . cyanocobalamin 100 MCG tablet Take 100 mcg by mouth daily.    Marland Kitchen etanercept (ENBREL) 50 MG/ML injection Inject 50 mg into the skin once a week.    . ferrous sulfate 325 (65 FE) MG tablet Take 325 mg by mouth daily with breakfast.    . furosemide (LASIX) 20 MG tablet Take 20 mg by mouth daily.    Marland Kitchen gabapentin (NEURONTIN) 300 MG capsule Take 300 mg by mouth 2 (two) times daily.     Marland Kitchen ibuprofen (ADVIL,MOTRIN) 800 MG tablet Take 1 tablet (800 mg total) by mouth  every 8 (eight) hours as needed. (Patient taking differently: Take 800 mg by mouth every 8 (eight) hours as needed for moderate pain. ) 30 tablet 5  . metoprolol (LOPRESSOR) 100 MG tablet Take 100 mg by mouth 2 (two) times daily.    Marland Kitchen omeprazole (PRILOSEC) 40 MG capsule Take 40 mg by mouth daily as needed (heartburn).   2  . predniSONE (DELTASONE) 5 MG tablet Take 2.5 mg by mouth daily.   1  . sulindac (CLINORIL) 200 MG tablet Take 1 tablet (200 mg total) by mouth 2 (two) times daily. (Patient not taking: Reported on 03/23/2015) 60 tablet 11   No current facility-administered medications for this visit.    Review of Systems Review of Systems Constitutional: negative for fatigue and weight loss Respiratory: negative for cough and wheezing Cardiovascular: negative for chest pain, fatigue and palpitations Gastrointestinal: negative for abdominal pain and change in bowel habits Genitourinary:negative Integument/breast: negative for nipple discharge Musculoskeletal:negative for myalgias Neurological: negative for gait problems and tremors Behavioral/Psych: negative for abusive relationship, depression Endocrine: negative for temperature intolerance     Blood pressure 125/81, pulse 63, temperature 98.1 F (36.7 C), weight 139 lb (63.05 kg), last menstrual period 10/20/2012.  Physical Exam Physical Exam  General:  Alert and no distress  Abdomen:  normal findings: no organomegaly, soft, non-tender and no hernia  Pelvis:  External genitalia: normal general appearance Urinary system: urethral meatus normal and bladder without fullness, nontender Vaginal: normal without tenderness, induration or masses Cervix: normal appearance Adnexa: normal bimanual exam Uterus: anteverted and non-tender, normal size      Data Reviewed Pathology  Assessment     HGSL     Plan    Continue pap smears q 6 months.   Orders Placed This Encounter  Procedures  . Flu Vaccine QUAD 36+ mos IM    No orders of the defined types were placed in this encounter.

## 2015-03-25 LAB — PAP, TP IMAGING W/ HPV RNA, RFLX HPV TYPE 16,18/45: HPV MRNA, HIGH RISK: NOT DETECTED

## 2015-04-26 ENCOUNTER — Other Ambulatory Visit: Payer: Self-pay | Admitting: Obstetrics

## 2015-09-13 ENCOUNTER — Other Ambulatory Visit: Payer: Self-pay | Admitting: Obstetrics

## 2015-09-15 NOTE — Telephone Encounter (Signed)
Please review for refill.  

## 2015-09-21 ENCOUNTER — Ambulatory Visit (INDEPENDENT_AMBULATORY_CARE_PROVIDER_SITE_OTHER): Payer: Medicaid Other | Admitting: Obstetrics

## 2015-09-21 ENCOUNTER — Encounter: Payer: Self-pay | Admitting: Obstetrics

## 2015-09-21 DIAGNOSIS — R896 Abnormal cytological findings in specimens from other organs, systems and tissues: Secondary | ICD-10-CM

## 2015-09-21 DIAGNOSIS — R82998 Other abnormal findings in urine: Secondary | ICD-10-CM

## 2015-09-21 DIAGNOSIS — R87810 Cervical high risk human papillomavirus (HPV) DNA test positive: Secondary | ICD-10-CM

## 2015-09-21 DIAGNOSIS — R8299 Other abnormal findings in urine: Secondary | ICD-10-CM | POA: Diagnosis not present

## 2015-09-21 DIAGNOSIS — R87610 Atypical squamous cells of undetermined significance on cytologic smear of cervix (ASC-US): Secondary | ICD-10-CM

## 2015-09-21 DIAGNOSIS — IMO0002 Reserved for concepts with insufficient information to code with codable children: Secondary | ICD-10-CM

## 2015-09-21 LAB — POCT URINALYSIS DIPSTICK
Bilirubin, UA: NEGATIVE
GLUCOSE UA: NEGATIVE
Ketones, UA: NEGATIVE
Leukocytes, UA: NEGATIVE
NITRITE UA: NEGATIVE
PROTEIN UA: NEGATIVE
RBC UA: NEGATIVE
SPEC GRAV UA: 1.015
UROBILINOGEN UA: NEGATIVE
pH, UA: 5

## 2015-09-21 NOTE — Progress Notes (Signed)
Patient is in the office for her 6 month pap. Patient reports she is doing well- she does states her urine is darker and ask if we can check it.

## 2015-09-21 NOTE — Progress Notes (Signed)
Patient ID: Anna Carlson, female   DOB: 01/11/1961, 55 y.o.   MRN: [id number]  No chief complaint on file.   HPI Anna Carlson is a 55 y.o. female.  H/O CIN 2 colposcopic biopsies.  S/P Cryocautery of cervix 11-06-14.  1st pap post cryo was WNL's on 03-23-15.  Presents today for 2nd pap per Dysplasia Protocol.  No complaints.   HPI  Past Medical History  Diagnosis Date  . Arthritis   . Hypertension   . Angina   . PVD (peripheral vascular disease) ([place]) 11/28/2013  . Bilateral leg pain     Past Surgical History  Procedure Laterality Date  . Arthroscopy knee w/ drilling Right 2008  . Foot surgery Bilateral 2008,2009    Family History  Problem Relation Age of Onset  . Diabetes Mother   . Hypertension Mother   . Arthritis Mother   . Cataracts Mother   . Hyperlipidemia Father   . Diabetes Father   . Heart attack Father     Social History Social History  Substance Use Topics  . Smoking status: Former Research scientist (life sciences)  . Smokeless tobacco: Never Used  . Alcohol Use: No    No Known Allergies  Current Outpatient Prescriptions  Medication Sig Dispense Refill  . albuterol (PROAIR HFA) 108 (90 BASE) MCG/ACT inhaler Inhale 1-2 puffs into the lungs every 6 (six) hours as needed for wheezing or shortness of breath.    Marland Kitchen b complex vitamins tablet Take 1 tablet by mouth daily.    . cholecalciferol (VITAMIN D) 1000 UNITS tablet Take 1,000 Units by mouth daily.  3  . cyanocobalamin 100 MCG tablet Take 100 mcg by mouth daily.    Marland Kitchen dicyclomine (BENTYL) 20 MG tablet Take 20 mg by mouth every 6 (six) hours.    Marland Kitchen etanercept (ENBREL) 50 MG/ML injection Inject 50 mg into the skin once a week.    . ferrous sulfate 325 (65 FE) MG tablet Take 325 mg by mouth daily with breakfast.    . furosemide (LASIX) 20 MG tablet Take 20 mg by mouth daily.    Marland Kitchen gabapentin (NEURONTIN) 300 MG capsule Take 300 mg by mouth 2 (two) times daily.     Marland Kitchen ibuprofen (ADVIL,MOTRIN) 800 MG tablet TAKE 1 TABLET BY MOUTH  EVERY 8 HOURS AS NEEDED 30 tablet 5  . leflunomide (ARAVA) 10 MG tablet Take 10 mg by mouth daily.    . metoprolol (LOPRESSOR) 100 MG tablet Take 50 mg by mouth 2 (two) times daily.     Marland Kitchen omeprazole (PRILOSEC) 40 MG capsule Take 40 mg by mouth daily as needed (heartburn).   2  . predniSONE (DELTASONE) 5 MG tablet Take 1 mg by mouth daily.   1   No current facility-administered medications for this visit.    Review of Systems Review of Systems Constitutional: negative for fatigue and weight loss Respiratory: negative for cough and wheezing Cardiovascular: negative for chest pain, fatigue and palpitations Gastrointestinal: negative for abdominal pain and change in bowel habits Genitourinary:negative Integument/breast: negative for nipple discharge Musculoskeletal:negative for myalgias Neurological: negative for gait problems and tremors Behavioral/Psych: negative for abusive relationship, depression Endocrine: negative for temperature intolerance     Last menstrual period 10/20/2012.  Physical Exam Physical Exam           General:  Alert and no distress Abdomen:  normal findings: no organomegaly, soft, non-tender and no hernia  Pelvis:  External genitalia: normal general appearance Urinary system: urethral meatus normal and bladder  without fullness, nontender Vaginal: normal without tenderness, induration or masses Cervix: normal appearance Adnexa: normal bimanual exam Uterus: anteverted and non-tender, normal size      Data Reviewed Pathology  Assessment     CIN 2.  Cryocautery of Cervix done.  Post Cryo Pap Dysplasia Protocol    Plan    Repeat pap 4 months.  Orders Placed This Encounter  Procedures  . POCT urinalysis dipstick   Meds ordered this encounter  Medications  . leflunomide (ARAVA) 10 MG tablet    Sig: Take 10 mg by mouth daily.  Marland Kitchen dicyclomine (BENTYL) 20 MG tablet    Sig: Take 20 mg by mouth every 6 (six) hours.

## 2015-09-24 LAB — PAP IG (IMAGE GUIDED): PAP SMEAR COMMENT: 0

## 2015-09-28 ENCOUNTER — Ambulatory Visit: Payer: Medicaid Other | Admitting: Obstetrics

## 2015-09-30 ENCOUNTER — Emergency Department (HOSPITAL_COMMUNITY)
Admission: EM | Admit: 2015-09-30 | Discharge: 2015-09-30 | Disposition: A | Discharge status: Home / Self Care | Payer: Medicaid Other | Attending: Emergency Medicine | Admitting: Emergency Medicine

## 2015-09-30 ENCOUNTER — Emergency Department (HOSPITAL_COMMUNITY): Payer: Medicaid Other

## 2015-09-30 ENCOUNTER — Encounter (HOSPITAL_COMMUNITY): Payer: Self-pay

## 2015-09-30 DIAGNOSIS — I209 Angina pectoris, unspecified: Secondary | ICD-10-CM | POA: Diagnosis not present

## 2015-09-30 DIAGNOSIS — Z79899 Other long term (current) drug therapy: Secondary | ICD-10-CM | POA: Diagnosis not present

## 2015-09-30 DIAGNOSIS — Z7952 Long term (current) use of systemic steroids: Secondary | ICD-10-CM | POA: Diagnosis not present

## 2015-09-30 DIAGNOSIS — R200 Anesthesia of skin: Secondary | ICD-10-CM | POA: Insufficient documentation

## 2015-09-30 DIAGNOSIS — Z87891 Personal history of nicotine dependence: Secondary | ICD-10-CM | POA: Insufficient documentation

## 2015-09-30 DIAGNOSIS — M1990 Unspecified osteoarthritis, unspecified site: Secondary | ICD-10-CM | POA: Insufficient documentation

## 2015-09-30 DIAGNOSIS — R51 Headache: Secondary | ICD-10-CM | POA: Diagnosis not present

## 2015-09-30 DIAGNOSIS — R531 Weakness: Secondary | ICD-10-CM | POA: Insufficient documentation

## 2015-09-30 DIAGNOSIS — R29898 Other symptoms and signs involving the musculoskeletal system: Secondary | ICD-10-CM

## 2015-09-30 DIAGNOSIS — R6883 Chills (without fever): Secondary | ICD-10-CM | POA: Diagnosis not present

## 2015-09-30 DIAGNOSIS — I10 Essential (primary) hypertension: Secondary | ICD-10-CM | POA: Insufficient documentation

## 2015-09-30 LAB — CBC
HEMATOCRIT: 39.9 % (ref 36.0–46.0)
HEMOGLOBIN: 13.2 g/dL (ref 12.0–15.0)
MCH: 28.4 pg (ref 26.0–34.0)
MCHC: 33.1 g/dL (ref 30.0–36.0)
MCV: 85.8 fL (ref 78.0–100.0)
Platelets: 190 10*3/uL (ref 150–400)
RBC: 4.65 MIL/uL (ref 3.87–5.11)
RDW: 14.3 % (ref 11.5–15.5)
WBC: 10.2 10*3/uL (ref 4.0–10.5)

## 2015-09-30 LAB — COMPREHENSIVE METABOLIC PANEL
ALT: 16 U/L (ref 14–54)
AST: 19 U/L (ref 15–41)
Albumin: 3.7 g/dL (ref 3.5–5.0)
Alkaline Phosphatase: 53 U/L (ref 38–126)
Anion gap: 11 (ref 5–15)
BILIRUBIN TOTAL: 0.3 mg/dL (ref 0.3–1.2)
BUN: 16 mg/dL (ref 6–20)
CHLORIDE: 106 mmol/L (ref 101–111)
CO2: 24 mmol/L (ref 22–32)
CREATININE: 0.62 mg/dL (ref 0.44–1.00)
Calcium: 9 mg/dL (ref 8.9–10.3)
Glucose, Bld: 92 mg/dL (ref 65–99)
POTASSIUM: 3.6 mmol/L (ref 3.5–5.1)
Sodium: 141 mmol/L (ref 135–145)
TOTAL PROTEIN: 7.1 g/dL (ref 6.5–8.1)

## 2015-09-30 LAB — DIFFERENTIAL
BASOS PCT: 0 %
Basophils Absolute: 0 10*3/uL (ref 0.0–0.1)
EOS ABS: 0.1 10*3/uL (ref 0.0–0.7)
Eosinophils Relative: 1 %
LYMPHS ABS: 1.4 10*3/uL (ref 0.7–4.0)
Lymphocytes Relative: 14 %
MONO ABS: 0.8 10*3/uL (ref 0.1–1.0)
MONOS PCT: 8 %
Neutro Abs: 7.8 10*3/uL — ABNORMAL HIGH (ref 1.7–7.7)
Neutrophils Relative %: 77 %

## 2015-09-30 LAB — I-STAT TROPONIN, ED: TROPONIN I, POC: 0 ng/mL (ref 0.00–0.08)

## 2015-09-30 LAB — PROTIME-INR
INR: 0.96 (ref 0.00–1.49)
Prothrombin Time: 13 seconds (ref 11.6–15.2)

## 2015-09-30 LAB — APTT: aPTT: 25 seconds (ref 24–37)

## 2015-09-30 NOTE — Discharge Instructions (Signed)
The CT scan and MRI did not show any evidence of stroke causing your symptoms.  MRI did show some spinal stenosis, however this was only a mild change which is unlikely to be causing your symptoms. I spoke with our on-call Neurologist who recommends that you have further workup on an outpatient basis to determine the cause of your symptoms. Please be reassured that you did not have a stroke.  It is unclear if Enbrel could be causing your symptoms. Please discuss with your Rheumatologist about your concerns.

## 2015-09-30 NOTE — ED Provider Notes (Signed)
CSN: [id number]     Arrival date & time 09/30/15  1113 History   First MD Initiated Contact with Patient 09/30/15 1621     Chief Complaint  Patient presents with  . Weakness     (Consider location/radiation/quality/duration/timing/severity/associated sxs/prior Treatment) HPI  Ms. Anna Carlson is a 55 year old female with PMH of rheumatoid arthritis and HTN who presents with right arm weakness. Her symptoms began about 1 month ago and she reports having intermittent right arm weakness which she notices when she is feeding herself, grabbing objects, or brushing her hair. She says this only lasts up to a few minutes and has occurred every few days or so. She says the weakness feels like her arm is "paralyzed" with some numbness associated. She also reports a left sided headache and occasionally feeling "off-balance." She was told by her PCP to come to the ED if she had another episode which did occur earlier today. She says her right arm weakness, left sided headache, and gait abnormality returned earlier today. She says her gait issue only occurs sporadically and consists of her staggering to the side when she intends to walk straight. She feels nearly to her regular self at this time other than her headache.   Past Medical History  Diagnosis Date  . Arthritis   . Hypertension   . Angina   . PVD (peripheral vascular disease) ([place]) 11/28/2013  . Bilateral leg pain    Past Surgical History  Procedure Laterality Date  . Arthroscopy knee w/ drilling Right 2008  . Foot surgery Bilateral 2008,2009   Family History  Problem Relation Age of Onset  . Diabetes Mother   . Hypertension Mother   . Arthritis Mother   . Cataracts Mother   . Hyperlipidemia Father   . Diabetes Father   . Heart attack Father    Social History  Substance Use Topics  . Smoking status: Former Research scientist (life sciences)  . Smokeless tobacco: Never Used  . Alcohol Use: No   OB History    No data available     Review of Systems   Constitutional: Positive for chills. Negative for fever, diaphoresis, activity change and appetite change.  Eyes: Negative for visual disturbance.  Respiratory: Negative for chest tightness and shortness of breath.   Cardiovascular: Negative for chest pain.  Gastrointestinal: Negative for nausea, vomiting, abdominal pain and diarrhea.  Musculoskeletal: Negative for myalgias.  Neurological: Positive for weakness, numbness and headaches. Negative for tremors, syncope, facial asymmetry and speech difficulty.       Right arm weakness and numbness, headache left side of face, gait instability      Allergies  Review of patient's allergies indicates no known allergies.  Home Medications   Prior to Admission medications   Medication Sig Start Date End Date Taking? Authorizing Provider  albuterol (PROAIR HFA) 108 (90 BASE) MCG/ACT inhaler Inhale 1-2 puffs into the lungs every 6 (six) hours as needed for wheezing or shortness of breath.   Yes Historical Provider, MD  b complex vitamins tablet Take 1 tablet by mouth daily.   Yes Historical Provider, MD  cholecalciferol (VITAMIN D) 1000 UNITS tablet Take 1,000 Units by mouth daily. 05/05/14  Yes Historical Provider, MD  cyanocobalamin 100 MCG tablet Take 100 mcg by mouth daily.   Yes Historical Provider, MD  dicyclomine (BENTYL) 20 MG tablet Take 20 mg by mouth every 6 (six) hours.   Yes Historical Provider, MD  ferrous sulfate 325 (65 FE) MG tablet Take 325 mg by  mouth daily with breakfast.   Yes Historical Provider, MD  furosemide (LASIX) 20 MG tablet Take 20 mg by mouth daily.   Yes Historical Provider, MD  gabapentin (NEURONTIN) 300 MG capsule Take 300 mg by mouth 2 (two) times daily.    Yes Historical Provider, MD  ibuprofen (ADVIL,MOTRIN) 800 MG tablet TAKE 1 TABLET BY MOUTH EVERY 8 HOURS AS NEEDED 09/17/15  Yes Shelly Bombard, MD  leflunomide (ARAVA) 10 MG tablet Take 10 mg by mouth daily.   Yes Historical Provider, MD  metoprolol  (LOPRESSOR) 100 MG tablet Take 50 mg by mouth 2 (two) times daily.    Yes Historical Provider, MD  omeprazole (PRILOSEC) 40 MG capsule Take 40 mg by mouth daily as needed (heartburn).  05/27/14  Yes Historical Provider, MD  predniSONE (DELTASONE) 5 MG tablet Take 2 mg by mouth daily.  05/27/14  Yes Historical Provider, MD  traZODone (DESYREL) 150 MG tablet Take 150 mg by mouth at bedtime. 09/20/15  Yes Historical Provider, MD   BP 127/73 mmHg  Pulse 70  Temp(Src) 97.8 F (36.6 C) (Oral)  Resp 15  Ht 5\' 2"  (1.575 m)  Wt 57.607 kg  BMI 23.22 kg/m2  SpO2 100%  LMP 10/20/2012 Physical Exam  Constitutional: She is oriented to person, place, and time. She appears well-developed and well-nourished. No distress.  HENT:  Head: Normocephalic and atraumatic.  Mouth/Throat: Oropharynx is clear and moist.  Eyes: EOM are normal. Pupils are equal, round, and reactive to light.  Neck: Normal range of motion.  Cardiovascular: Normal rate and regular rhythm.   No murmur heard. Pulmonary/Chest: Effort normal. No respiratory distress. She has no wheezes. She has no rales.  Abdominal: Soft. There is no tenderness.  Musculoskeletal: Normal range of motion. She exhibits no edema or tenderness.  Neurological: She is alert and oriented to person, place, and time. She has normal strength and normal reflexes. No cranial nerve deficit or sensory deficit. Coordination normal.  Strength intact and equal all extremities, finger grip strength intact, rapid finger tapping and finger to nose intact. Sensation intact bilaterally.  Skin: She is not diaphoretic.    ED Course  Procedures (including critical care time) Labs Review Labs Reviewed  DIFFERENTIAL - Abnormal; Notable for the following:    Neutro Abs 7.8 (*)    All other components within normal limits  PROTIME-INR  APTT  CBC  COMPREHENSIVE METABOLIC PANEL  I-STAT TROPOININ, ED    Imaging Review Ct Head Wo Contrast  09/30/2015  CLINICAL DATA:  Right  arm numbness and weakness. Headache. Symptoms intermittently since this morning EXAM: CT HEAD WITHOUT CONTRAST TECHNIQUE: Contiguous axial images were obtained from the base of the skull through the vertex without intravenous contrast. COMPARISON:  06/24/2014 FINDINGS: Skull and Sinuses:Negative for fracture or destructive process. Right ethmoid mucosal thickening. Frothy secretions in the left sphenoid sinus. Similar pattern was seen on 2016 head CT, but increased. Given the history this is presumably incidental. Visualized orbits: Negative. Brain: No evidence of acute infarction, hemorrhage, hydrocephalus, or mass lesion/mass effect. Normal cerebral volume and white matter appearance IMPRESSION: Normal intracranial imaging. Mild sinusitis. Electronically Signed   By: Monte Fantasia M.D.   On: 09/30/2015 12:26   Mr Brain Wo Contrast (neuro Protocol)  09/30/2015  CLINICAL DATA:  55 year old female with weakness, right upper extremity numbness for 1 month. Intermittent confusion and memory problems. Initial encounter. EXAM: MRI HEAD WITHOUT CONTRAST TECHNIQUE: Multiplanar, multiecho pulse sequences of the brain and surrounding structures were obtained  without intravenous contrast. COMPARISON:  Head CT without contrast 1215 hours today. Brain MRI 06/25/2014. FINDINGS: Major intracranial vascular flow voids are stable. Stable cerebral volume, within normal limits. No restricted diffusion to suggest acute infarction. No midline shift, mass effect, evidence of mass lesion, ventriculomegaly, extra-axial collection or acute intracranial hemorrhage. Cervicomedullary junction and pituitary are within normal limits. Stable and normal gray and white matter signal. Visible internal auditory structures appear normal. Trace left mastoid fluid is unchanged and significance is doubtful. Mildly increased bubbly opacity and fluid levels in the sphenoid sinuses. Increased right ethmoid mucosal thickening. Other paranasal sinuses  remain clear. Negative orbit and scalp soft tissues. Stable and negative visualized cervical spine. Visualized bone marrow signal is within normal limits. IMPRESSION: 1. Stable and normal noncontrast MRI appearance of the brain. 2. Mild to moderate paranasal sinus inflammation, increased compared to 2016. Electronically Signed   By: Genevie Ann M.D.   On: 09/30/2015 20:02   Mr Cervical Spine Wo Contrast  09/30/2015  CLINICAL DATA:  55 year old female with right upper extremity weakness and numbness for 1 month. Initial encounter. EXAM: MRI CERVICAL SPINE WITHOUT CONTRAST TECHNIQUE: Multiplanar, multisequence MR imaging of the cervical spine was performed. No intravenous contrast was administered. COMPARISON:  Brain MRI 1932 hours today and earlier. FINDINGS: Straightening and mild reversal of cervical lordosis. No marrow edema or evidence of acute osseous abnormality. Cervicomedullary junction is within normal limits. Spinal cord signal is within normal limits at all visualized levels. Negative paraspinal soft tissues. Partially visible sphenoid sinus fluid and/or mucosal thickening. C2-C3:  Negative. C3-C4: Borderline to mild facet and uncovertebral hypertrophy, no stenosis. C4-C5: Borderline to mild facet and uncovertebral hypertrophy, no stenosis. C5-C6: Mild left eccentric circumferential disc osteophyte complex with broad-based posterior component. Mild ligament flavum hypertrophy. Spinal stenosis with no spinal cord mass effect. Mild uncovertebral hypertrophy. Mild left greater than right C6 foraminal stenosis. C6-C7: Mild disc bulge. Mild facet and uncovertebral hypertrophy. Borderline to mild spinal stenosis with no cord mass effect. Inconsequential appearing right C7 perineural cyst (series 5, image 20). Otherwise no foraminal stenosis. C7-T1:  Negative. No upper thoracic spinal stenosis. IMPRESSION: 1. Mild degenerative cervical spinal stenosis at C5-C6 and C6-C7 related to disc, endplate, and ligament  flavum hypertrophy. No spinal cord mass effect or signal abnormality. 2. Associated mild left greater than right C6 foraminal stenosis. Electronically Signed   By: Genevie Ann M.D.   On: 09/30/2015 20:43   I have personally reviewed and evaluated these images and lab results as part of my medical decision-making.   EKG Interpretation None      MDM   Final diagnoses:  Right arm weakness   55 year old female with PMH of rheumatoid arthritis and HTN who presents with right arm weakness. She reports associated left sided headache and gait abnormality. She says these symptoms only occur occasionally, but has first noticed them about 1 month ago. She describes her weakness as her arm going "flaccid."  CT Head and MRI brain are without evidence for acute stroke. MRI cervical spine shows mild stenosis at C5-6 and C6-7 and mild left greater than right C6 foraminal stenosis. Dr. Alfonse Spruce and I both discussed the results with patient to reassure her that she did not have a stroke. I am not sure what is causing her intermittent right arm "flaccid" paralysis. Patient was anxious about further workup as she is concerned about TIA. I spoke with the on-call Neurologist who has recommended outpatient workup, but nothing concerning that would  require admission. I discussed this with patient who agrees and will follow up with Neurology outpatient. She will also speak with her PCP and Rheumatologist as she is concerned Enbrel may be related to her symptoms.      Zada Finders, MD 09/30/15 [id number]  Harvel Quale, MD 10/01/15 [phone]

## 2015-09-30 NOTE — ED Notes (Signed)
Patient here with weakness and numbness to right arm x 1 month. Has some intermittent confusion and memory problems for the last month as well. Reports that the arm numbness only last a few minutes. Feels unsteady with same

## 2015-10-05 ENCOUNTER — Ambulatory Visit (INDEPENDENT_AMBULATORY_CARE_PROVIDER_SITE_OTHER): Payer: Medicaid Other | Admitting: Diagnostic Neuroimaging

## 2015-10-05 ENCOUNTER — Encounter: Payer: Self-pay | Admitting: Diagnostic Neuroimaging

## 2015-10-05 VITALS — BP 110/80 | HR 65 | Ht 62.0 in | Wt 128.6 lb

## 2015-10-05 DIAGNOSIS — R200 Anesthesia of skin: Secondary | ICD-10-CM

## 2015-10-05 DIAGNOSIS — R413 Other amnesia: Secondary | ICD-10-CM | POA: Diagnosis not present

## 2015-10-05 DIAGNOSIS — G458 Other transient cerebral ischemic attacks and related syndromes: Secondary | ICD-10-CM

## 2015-10-05 DIAGNOSIS — R29898 Other symptoms and signs involving the musculoskeletal system: Secondary | ICD-10-CM

## 2015-10-05 DIAGNOSIS — R202 Paresthesia of skin: Secondary | ICD-10-CM

## 2015-10-05 DIAGNOSIS — R269 Unspecified abnormalities of gait and mobility: Secondary | ICD-10-CM

## 2015-10-05 MED ORDER — ASPIRIN EC 81 MG PO TBEC: 81.0000 mg | DELAYED_RELEASE_TABLET | Freq: Every day | ORAL | Status: DC

## 2015-10-05 NOTE — Progress Notes (Signed)
[facility]  PATIENT: Anna SMIGIELSKI DOB: 1961/06/15  REFERRING CLINICIAN: Sherrye Payor HISTORY FROM: patient  REASON FOR VISIT: new consult    HISTORICAL  CHIEF COMPLAINT:  Chief Complaint  Patient presents with  . Right Arm Weakness    rm 7, New Pt, Friend- Dan, "Can't move my right arm; feels paralyzed, I cannot feel it; Has happened at least 5 x in past 2 months."    HISTORY OF PRESENT ILLNESS:   55 year old right-handed female with hypertension, here for evaluation of right arm weakness.  Since January 2017 patient has had 5 episodes of transient right arm heaviness, numbness, weakness, paralysis lasting up to 1 minute at a time. Sometimes patients right leg gives out. Right face never had numbness or weakness. No slurred speech or trouble talking. Most recent episode occurred in April 2017 patient went to emergency room for evaluation. Patient had CT head, MRI brain which showed no evidence of acute stroke. MRI cervical spine showed mild degenerative changes. Patient was referred to me for further evaluation.  Patient also has history of rheumatoid arthritis since 2003, previously using a walker, now able to walk independently.  Patient also having intermittent left or right occipital headache sensations lasting up to 45 minutes and time. No nausea vomiting. No photophobia or phonophobia. Episodes happen once per week.   REVIEW OF SYSTEMS: Full 14 system review of systems performed and negative with exception of: Joint pain joint swelling aching muscles decreased energy disinterest in activities restless legs memory loss confusion headache numbness weakness blurred vision.  ALLERGIES: No Known Allergies  HOME MEDICATIONS: Outpatient Prescriptions Prior to Visit  Medication Sig Dispense Refill  . albuterol (PROAIR HFA) 108 (90 BASE) MCG/ACT inhaler Inhale 1-2 puffs into the lungs every 6 (six) hours as needed for wheezing or shortness of breath.    Marland Kitchen b  complex vitamins tablet Take 1 tablet by mouth daily.    . cholecalciferol (VITAMIN D) 1000 UNITS tablet Take 1,000 Units by mouth daily.  3  . cyanocobalamin 100 MCG tablet Take 100 mcg by mouth daily.    Marland Kitchen dicyclomine (BENTYL) 20 MG tablet Take 20 mg by mouth every 6 (six) hours.    . ferrous sulfate 325 (65 FE) MG tablet Take 325 mg by mouth daily with breakfast.    . furosemide (LASIX) 20 MG tablet Take 20 mg by mouth daily.    Marland Kitchen gabapentin (NEURONTIN) 300 MG capsule Take 300 mg by mouth 2 (two) times daily.     Marland Kitchen ibuprofen (ADVIL,MOTRIN) 800 MG tablet TAKE 1 TABLET BY MOUTH EVERY 8 HOURS AS NEEDED 30 tablet 5  . leflunomide (ARAVA) 10 MG tablet Take 10 mg by mouth daily.    Marland Kitchen omeprazole (PRILOSEC) 40 MG capsule Take 40 mg by mouth daily as needed (heartburn).   2  . predniSONE (DELTASONE) 5 MG tablet Take 2 mg by mouth daily.   1  . traZODone (DESYREL) 150 MG tablet Take 150 mg by mouth at bedtime.  2  . metoprolol (LOPRESSOR) 100 MG tablet Take 50 mg by mouth 2 (two) times daily.      No facility-administered medications prior to visit.    PAST MEDICAL HISTORY: Past Medical History  Diagnosis Date  . Arthritis   . Hypertension   . Angina   . PVD (peripheral vascular disease) ([place]) 11/28/2013  . Bilateral leg pain     PAST SURGICAL HISTORY: Past Surgical History  Procedure Laterality Date  . Arthroscopy knee  w/ drilling Right 2008  . Foot surgery Bilateral 2008,2009    FAMILY HISTORY: Family History  Problem Relation Age of Onset  . Diabetes Mother   . Hypertension Mother   . Arthritis Mother   . Cataracts Mother   . Hyperlipidemia Father   . Diabetes Father   . Heart attack Father   . Diabetes type I Daughter     SOCIAL HISTORY:  Social History   Social History  . Marital Status: Single    Spouse Name: N/A  . Number of Children: 8  . Years of Education: 12   Occupational History  .      home maker   Social History Main Topics  . Smoking status:  Former Research scientist (life sciences)  . Smokeless tobacco: Never Used     Comment: smoked as teen, quit years ago  . Alcohol Use: No     Comment: socially  . Drug Use: No  . Sexual Activity: Not Currently   Other Topics Concern  . Not on file   Social History Narrative   Lives with daughter   Caffeine use- coffee, tea, soda: 2-3 daily     PHYSICAL EXAM  GENERAL EXAM/CONSTITUTIONAL: Vitals:  Filed Vitals:   10/05/15 0817  BP: 110/80  Pulse: 65  Height: 5\' 2"  (1.575 m)  Weight: 128 lb 9.6 oz (58.333 kg)     Body mass index is 23.52 kg/(m^2).  Visual Acuity Screening   Right eye Left eye Both eyes  Without correction: 20/70 20/200   With correction:     Comments: 10/05/15 did not have glasses with her    Patient is in no distress; well developed, nourished and groomed; neck is supple  CARDIOVASCULAR:  Examination of carotid arteries is normal; no carotid bruits  Regular rate and rhythm, no murmurs  Examination of peripheral vascular system by observation and palpation is normal  EYES:  Ophthalmoscopic exam of optic discs and posterior segments is normal; no papilledema or hemorrhages  MUSCULOSKELETAL:  Gait, strength, tone, movements noted in Neurologic exam below  NEUROLOGIC: MENTAL STATUS:  No flowsheet data found.  awake, alert, oriented to person, place and time  recent and remote memory intact  normal attention and concentration  language fluent, comprehension intact, naming intact,   fund of knowledge appropriate  CRANIAL NERVE:   2nd - no papilledema on fundoscopic exam  2nd, 3rd, 4th, 6th - pupils equal and reactive to light, visual fields full to confrontation, extraocular muscles intact, no nystagmus  5th - facial sensation symmetric  7th - facial strength symmetric  8th - hearing intact  9th - palate elevates symmetrically, uvula midline  11th - shoulder shrug symmetric  12th - tongue protrusion midline  MOTOR:   normal bulk and tone, SLOW  MOVEMENTS; LIMITED BY PAIN; SLIGHT DECR STRENGTH (4+) IN RUE AND RLE COMPARED TO LEFT SIDE  SENSORY:   normal and symmetric to light touch, pinprick, temperature, vibration; EXCEPT DECR TEMP AND VIB IN RIGHT FOOT  COORDINATION:   finger-nose-finger, fine finger movements SLOW  REFLEXES:   deep tendon reflexes present and symmetric  GAIT/STATION:   narrow based gait; SLOW CAUTIOUS GAIT    DIAGNOSTIC DATA (LABS, IMAGING, TESTING) - I reviewed patient records, labs, notes, testing and imaging myself where available.  Lab Results  Component Value Date   WBC 10.2 09/30/2015   HGB 13.2 09/30/2015   HCT 39.9 09/30/2015   MCV 85.8 09/30/2015   PLT 190 09/30/2015      Component Value  Date/Time   NA 141 09/30/2015 1141   K 3.6 09/30/2015 1141   CL 106 09/30/2015 1141   CO2 24 09/30/2015 1141   GLUCOSE 92 09/30/2015 1141   BUN 16 09/30/2015 1141   CREATININE 0.62 09/30/2015 1141   CREATININE 0.68 11/07/2012 1710   CALCIUM 9.0 09/30/2015 1141   PROT 7.1 09/30/2015 1141   ALBUMIN 3.7 09/30/2015 1141   AST 19 09/30/2015 1141   ALT 16 09/30/2015 1141   ALKPHOS 53 09/30/2015 1141   BILITOT 0.3 09/30/2015 1141   GFRNONAA >60 09/30/2015 1141   GFRAA >60 09/30/2015 1141   Lab Results  Component Value Date   CHOL 181 11/13/2012   HDL 57 11/13/2012   LDLCALC 93 11/13/2012   TRIG 154* 11/13/2012   CHOLHDL 3.2 11/13/2012   Lab Results  Component Value Date   HGBA1C 5.5 09/17/2011   Lab Results  Component Value Date   [id number] 04/05/2007   Lab Results  Component Value Date   TSH 1.498 11/07/2012    09/16/11 CT angiogram chest 1. No evidence of pulmonary embolism. 2. No acute findings in the thorax to account for the patient's symptoms.  07/07/13 BLE u/s  - No evidence of deep vein or superficial thrombosis involving the right lower extremity and left lowerextremity. - No evidence of Baker's cyst on the right or left.  09/30/15 MRI brain [I reviewed images  myself and agree with interpretation. -VRP]  1. Stable and normal noncontrast MRI appearance of the brain. 2. Mild to moderate paranasal sinus inflammation, increased compared to 2016.  09/30/15 MRI cervical [I reviewed images myself and agree with interpretation. -VRP]  1. Mild degenerative cervical spinal stenosis at C5-C6 and C6-C7 related to disc, endplate, and ligament flavum hypertrophy. No spinal cord mass effect or signal abnormality. 2. Associated mild left greater than right C6 foraminal stenosis.      ASSESSMENT AND PLAN  55 y.o. year old female here with Intermittent episodes of right arm numbness and weakness suspicious for intermittent transient ischemic attacks. Intermittent nerve compression from cervical spine degenerative disease also possible. Will complete TIA workup with ultrasound of heart and carotid arteries. Advised patient to follow-up with PCP to follow-up on other stroke risk factors, as patient has recently been tested within last 3 months.  Ddx: TIA vs intermittent nerve compression  1. Other specified transient cerebral ischemias   2. Right arm numbness   3. Right arm weakness   4. Memory loss   5. Gait difficulty     PLAN: - start aspirin 81mg  daily - I will check echocardiogram (heart) and carotid ultrasound - follow up with PCP to check sugar, cholesterol and blood pressure testing (last tested < 3 months go) - will defer memory evaluation for next visit (MMSE, sleep study, labs)  Orders Placed This Encounter  Procedures  . ECHOCARDIOGRAM COMPLETE   Meds ordered this encounter  Medications  . aspirin EC 81 MG tablet    Sig: Take 1 tablet (81 mg total) by mouth daily.   Return in about 6 weeks (around 11/16/2015).  I reviewed images, labs, notes, records myself. I summarized findings and reviewed with patient, for this high risk condition (TIA) requiring high complexity decision making.     Penni Bombard, MD [id number], [id number]  AM Certified in Neurology, Neurophysiology and Neuroimaging  [facility] [address] [place], [place] [address] [phone]

## 2015-10-05 NOTE — Patient Instructions (Addendum)
Thank you for coming to see [place] at [facility]. I hope we have been able to provide you high quality care today.  You may receive a patient satisfaction survey over the next few weeks. We would appreciate your feedback and comments so that we may continue to improve ourselves and the health of our patients.  - start aspirin 15m daily - I will check echocardiogram (heart) and carotid ultrasound - follow up with PCP to check sugar, cholesterol and blood pressure testing   ~~~~~~~~~~~~~~~~~~~~~~~~~~~~~~~~~~~~~~~~~~~~~~~~~~~~~~~~~~~~~~~~~  DR. Karah Caruthers'S GUIDE TO HAPPY AND HEALTHY LIVING These are some of my general health and wellness recommendations. Some of them may apply to you better than others. Please use common sense as you try these suggestions and feel free to ask me any questions.   ACTIVITY/FITNESS Mental, social, emotional and physical stimulation are very important for brain and body health. Try learning a new activity (arts, music, language, sports, games).  Keep moving your body to the best of your abilities. You can do this at home, inside or outside, the park, community center, gym or anywhere you like. Consider a physical therapist or personal trainer to get started. Consider the app Sworkit. Fitness trackers such as smart-watches, smart-phones or Fitbits can help as well.   NUTRITION Eat more plants: colorful vegetables, nuts, seeds and berries.  Eat less sugar, salt, preservatives and processed foods.  Avoid toxins such as cigarettes and alcohol.  Drink water when you are thirsty. Warm water with a slice of lemon is an excellent morning drink to start the day.  Consider these websites for more information The Nutrition Source (hhttps://www.henry-hernandez.biz/ [employer] Nutrition (wWindowBlog.ch   RELAXATION Consider practicing mindfulness meditation or other relaxation techniques such as deep  breathing, prayer, yoga, tai chi, massage. See website mindful.org or the apps Headspace or Calm to help get started.   SLEEP Try to get at least 7-8+ hours sleep per day. Regular exercise and reduced caffeine will help you sleep better. Practice good sleep hygeine techniques. See website sleep.org for more information.   PLANNING Prepare estate planning, living will, healthcare POA documents. Sometimes this is best planned with the help of an attorney. Theconversationproject.org and agingwithdignity.org are excellent resources.

## 2015-10-14 ENCOUNTER — Ambulatory Visit (HOSPITAL_BASED_OUTPATIENT_CLINIC_OR_DEPARTMENT_OTHER)
Admission: RE | Admit: 2015-10-14 | Discharge: 2015-10-14 | Disposition: A | Discharge status: Home / Self Care | Payer: Medicaid Other | Source: Ambulatory Visit | Attending: Diagnostic Neuroimaging | Admitting: Diagnostic Neuroimaging

## 2015-10-14 ENCOUNTER — Ambulatory Visit (HOSPITAL_COMMUNITY)
Admission: RE | Admit: 2015-10-14 | Discharge: 2015-10-14 | Disposition: A | Discharge status: Home / Self Care | Payer: Medicaid Other | Source: Ambulatory Visit | Attending: Cardiology | Admitting: Cardiology

## 2015-10-14 DIAGNOSIS — Z87891 Personal history of nicotine dependence: Secondary | ICD-10-CM | POA: Diagnosis not present

## 2015-10-14 DIAGNOSIS — I10 Essential (primary) hypertension: Secondary | ICD-10-CM | POA: Diagnosis not present

## 2015-10-14 DIAGNOSIS — I340 Nonrheumatic mitral (valve) insufficiency: Secondary | ICD-10-CM | POA: Insufficient documentation

## 2015-10-14 DIAGNOSIS — G458 Other transient cerebral ischemic attacks and related syndromes: Secondary | ICD-10-CM | POA: Insufficient documentation

## 2015-10-14 DIAGNOSIS — R202 Paresthesia of skin: Secondary | ICD-10-CM | POA: Insufficient documentation

## 2015-10-14 DIAGNOSIS — R29898 Other symptoms and signs involving the musculoskeletal system: Secondary | ICD-10-CM

## 2015-10-14 DIAGNOSIS — R200 Anesthesia of skin: Secondary | ICD-10-CM

## 2015-10-14 DIAGNOSIS — I351 Nonrheumatic aortic (valve) insufficiency: Secondary | ICD-10-CM | POA: Insufficient documentation

## 2015-10-14 DIAGNOSIS — G459 Transient cerebral ischemic attack, unspecified: Secondary | ICD-10-CM | POA: Diagnosis present

## 2015-11-29 ENCOUNTER — Encounter: Payer: Self-pay | Admitting: Diagnostic Neuroimaging

## 2015-11-29 ENCOUNTER — Ambulatory Visit (INDEPENDENT_AMBULATORY_CARE_PROVIDER_SITE_OTHER): Payer: Medicaid Other | Admitting: Diagnostic Neuroimaging

## 2015-11-29 VITALS — BP 126/85 | HR 61 | Ht 62.0 in | Wt 130.0 lb

## 2015-11-29 DIAGNOSIS — R413 Other amnesia: Secondary | ICD-10-CM

## 2015-11-29 DIAGNOSIS — G458 Other transient cerebral ischemic attacks and related syndromes: Secondary | ICD-10-CM | POA: Diagnosis not present

## 2015-11-29 DIAGNOSIS — Z72820 Sleep deprivation: Secondary | ICD-10-CM | POA: Diagnosis not present

## 2015-11-29 NOTE — Progress Notes (Signed)
[facility]  PATIENT: Anna Carlson DOB: [birth year]  REFERRING CLINICIAN: Sherrye Payor HISTORY FROM: patient and friend Linna Hoff) REASON FOR VISIT: follow up   HISTORICAL  CHIEF COMPLAINT:  Chief Complaint  Patient presents with  . Other specified transient cerebral ischemias    rm 7, friend- Dan, saw PCP- on Crestor now, "no further episodes of right arm paralysis"  . Follow-up    6 week    HISTORY OF PRESENT ILLNESS:   UPDATE 11/29/15: Since last visit, doing better. No more right sided weakness events. Memory is still poor. Still with interrupted sleep (wake up every 2 hours).   PRIOR HPI (10/05/15): 55 year old right-handed female with hypertension, here for evaluation of right arm weakness. Since January 2017 patient has had 5 episodes of transient right arm heaviness, numbness, weakness, paralysis lasting up to 1 minute at a time. Sometimes patients right leg gives out. Right face never had numbness or weakness. No slurred speech or trouble talking. Most recent episode occurred in April 2017 patient went to emergency room for evaluation. Patient had CT head, MRI brain which showed no evidence of acute stroke. MRI cervical spine showed mild degenerative changes. Patient was referred to me for further evaluation. Patient also has history of rheumatoid arthritis since 2003, previously using a walker, now able to walk independently. Patient also having intermittent left or right occipital headache sensations lasting up to 45 minutes and time. No nausea vomiting. No photophobia or phonophobia. Episodes happen once per week.   REVIEW OF SYSTEMS: Full 14 system review of systems performed and negative with exception of: Joint pain joint swelling aching muscles decreased energy disinterest in activities restless legs memory loss confusion headache numbness weakness blurred vision.  ALLERGIES: No Known Allergies  HOME MEDICATIONS: Outpatient Prescriptions Prior to Visit   Medication Sig Dispense Refill  . albuterol (PROAIR HFA) 108 (90 BASE) MCG/ACT inhaler Inhale 1-2 puffs into the lungs every 6 (six) hours as needed for wheezing or shortness of breath.    Marland Kitchen aspirin EC 81 MG tablet Take 1 tablet (81 mg total) by mouth daily.    Marland Kitchen b complex vitamins tablet Take 1 tablet by mouth daily.    . cholecalciferol (VITAMIN D) 1000 UNITS tablet Take 1,000 Units by mouth daily.  3  . cyanocobalamin 100 MCG tablet Take 100 mcg by mouth daily.    Marland Kitchen dicyclomine (BENTYL) 20 MG tablet Take 20 mg by mouth every 6 (six) hours.    . ferrous sulfate 325 (65 FE) MG tablet Take 325 mg by mouth daily with breakfast.    . furosemide (LASIX) 20 MG tablet Take 20 mg by mouth daily.    Marland Kitchen gabapentin (NEURONTIN) 300 MG capsule Take 300 mg by mouth 2 (two) times daily.     Marland Kitchen ibuprofen (ADVIL,MOTRIN) 800 MG tablet TAKE 1 TABLET BY MOUTH EVERY 8 HOURS AS NEEDED 30 tablet 5  . leflunomide (ARAVA) 10 MG tablet Take 10 mg by mouth daily.    . metoprolol (LOPRESSOR) 50 MG tablet 50 mg daily.  2  . omeprazole (PRILOSEC) 40 MG capsule Take 40 mg by mouth daily as needed (heartburn).   2  . predniSONE (DELTASONE) 5 MG tablet Take 2 mg by mouth daily.   1  . traZODone (DESYREL) 150 MG tablet Take 150 mg by mouth at bedtime.  2   No facility-administered medications prior to visit.    PAST MEDICAL HISTORY: Past Medical History  Diagnosis Date  . Arthritis   .  Hypertension   . Angina   . PVD (peripheral vascular disease) ([place]) 11/28/2013  . Bilateral leg pain     PAST SURGICAL HISTORY: Past Surgical History  Procedure Laterality Date  . Arthroscopy knee w/ drilling Right 2008  . Foot surgery Bilateral 2008,2009    FAMILY HISTORY: Family History  Problem Relation Age of Onset  . Diabetes Mother   . Hypertension Mother   . Arthritis Mother   . Cataracts Mother   . Hyperlipidemia Father   . Diabetes Father   . Heart attack Father   . Diabetes type I Daughter     SOCIAL  HISTORY:  Social History   Social History  . Marital Status: Single    Spouse Name: N/A  . Number of Children: 8  . Years of Education: 12   Occupational History  .      home maker   Social History Main Topics  . Smoking status: Former Research scientist (life sciences)  . Smokeless tobacco: Never Used     Comment: smoked as teen, quit years ago  . Alcohol Use: No     Comment: socially  . Drug Use: No  . Sexual Activity: Not Currently   Other Topics Concern  . Not on file   Social History Narrative   Lives with daughter   Caffeine use- coffee, tea, soda: 2-3 daily     PHYSICAL EXAM  GENERAL EXAM/CONSTITUTIONAL: Vitals:  Filed Vitals:   11/29/15 0843  BP: 126/85  Pulse: 61  Height: 5\' 2"  (1.575 m)  Weight: 130 lb (58.968 kg)   Body mass index is 23.77 kg/(m^2). No exam data present  Patient is in no distress; well developed, nourished and groomed; neck is supple  CARDIOVASCULAR:  Examination of carotid arteries is normal; no carotid bruits  Regular rate and rhythm, no murmurs  Examination of peripheral vascular system by observation and palpation is normal  EYES:  Ophthalmoscopic exam of optic discs and posterior segments is normal; no papilledema or hemorrhages  MUSCULOSKELETAL:  Gait, strength, tone, movements noted in Neurologic exam below  NEUROLOGIC: MENTAL STATUS:  No flowsheet data found.  awake, alert, oriented to person, place and time  recent and remote memory intact  normal attention and concentration  language fluent, comprehension intact, naming intact,   fund of knowledge appropriate  CRANIAL NERVE:   2nd - no papilledema on fundoscopic exam  2nd, 3rd, 4th, 6th - pupils equal and reactive to light, visual fields full to confrontation, extraocular muscles intact, no nystagmus  5th - facial sensation symmetric  7th - facial strength symmetric  8th - hearing intact  9th - palate elevates symmetrically, uvula midline  11th - shoulder shrug  symmetric  12th - tongue protrusion midline  MOTOR:   normal bulk and tone, BUE AND BLE FULL STRENGTH AND SYMM  SENSORY:   normal and symmetric to light touch, temperature, vibration  COORDINATION:   finger-nose-finger, fine finger movements SLOW  REFLEXES:   deep tendon reflexes present and symmetric  GAIT/STATION:   narrow based gait; SLIGHTLY CAUTIOUS GAIT    DIAGNOSTIC DATA (LABS, IMAGING, TESTING) - I reviewed patient records, labs, notes, testing and imaging myself where available.  Lab Results  Component Value Date   WBC 10.2 09/30/2015   HGB 13.2 09/30/2015   HCT 39.9 09/30/2015   MCV 85.8 09/30/2015   PLT 190 09/30/2015      Component Value Date/Time   NA 141 09/30/2015 1141   K 3.6 09/30/2015 1141   CL 106  09/30/2015 1141   CO2 24 09/30/2015 1141   GLUCOSE 92 09/30/2015 1141   BUN 16 09/30/2015 1141   CREATININE 0.62 09/30/2015 1141   CREATININE 0.68 11/07/2012 1710   CALCIUM 9.0 09/30/2015 1141   PROT 7.1 09/30/2015 1141   ALBUMIN 3.7 09/30/2015 1141   AST 19 09/30/2015 1141   ALT 16 09/30/2015 1141   ALKPHOS 53 09/30/2015 1141   BILITOT 0.3 09/30/2015 1141   GFRNONAA >60 09/30/2015 1141   GFRAA >60 09/30/2015 1141   Lab Results  Component Value Date   CHOL 181 11/13/2012   HDL 57 11/13/2012   LDLCALC 93 11/13/2012   TRIG 154* 11/13/2012   CHOLHDL 3.2 11/13/2012   Lab Results  Component Value Date   HGBA1C 5.5 09/17/2011   Lab Results  Component Value Date   [id number] 04/05/2007   Lab Results  Component Value Date   TSH 1.498 11/07/2012    09/16/11 CT angiogram chest 1. No evidence of pulmonary embolism. 2. No acute findings in the thorax to account for the patient's symptoms.  07/07/13 BLE u/s  - No evidence of deep vein or superficial thrombosis involving the right lower extremity and left lowerextremity. - No evidence of Baker's cyst on the right or left.  09/30/15 MRI brain [I reviewed images myself and agree  with interpretation. -VRP]  1. Stable and normal noncontrast MRI appearance of the brain. 2. Mild to moderate paranasal sinus inflammation, increased compared to 2016.  09/30/15 MRI cervical [I reviewed images myself and agree with interpretation. -VRP]  1. Mild degenerative cervical spinal stenosis at C5-C6 and C6-C7 related to disc, endplate, and ligament flavum hypertrophy. No spinal cord mass effect or signal abnormality. 2. Associated mild left greater than right C6 foraminal stenosis.  10/14/15: TTE - cardiac source of emboli  10/14/15 carotid u/s  - no internal carotid artery stenosis - antegrade flow of vertebral arteries    ASSESSMENT AND PLAN  55 y.o. year old female here with Intermittent episodes of right arm numbness and weakness suspicious for intermittent transient ischemic attacks. Intermittent nerve compression from cervical spine degenerative disease also possible. TIA workup completed.   Ddx: TIA vs intermittent nerve compression  1. Other specified transient cerebral ischemias   2. Memory loss   3. Sleep deprivation      PLAN: I spent 25 minutes of face to face time with patient. Greater than 50% of time was spent in counseling and coordination of care with patient. In summary we discussed:  - continue aspirin 81mg  daily + crestor  - memory loss likely due to chronic sleep deprivation and chronic pain; advised on strategies to help improve brain health (nutrition, physical activity, sleep)  Return if symptoms worsen or fail to improve, for return to PCP.     Penni Bombard, MD [id number], [id number] AM Certified in Neurology, Neurophysiology and Neuroimaging  [facility] [address] [place], [place] [address] [phone]

## 2016-01-20 ENCOUNTER — Ambulatory Visit: Payer: Medicaid Other | Admitting: Obstetrics

## 2016-03-16 ENCOUNTER — Ambulatory Visit: Payer: Self-pay | Admitting: Obstetrics

## 2016-07-13 ENCOUNTER — Encounter: Payer: Self-pay | Admitting: Podiatry

## 2016-07-13 ENCOUNTER — Ambulatory Visit (INDEPENDENT_AMBULATORY_CARE_PROVIDER_SITE_OTHER): Payer: Medicaid Other | Admitting: Podiatry

## 2016-07-13 VITALS — BP 140/84 | HR 73 | Ht 62.0 in | Wt 130.0 lb

## 2016-07-13 DIAGNOSIS — M2042 Other hammer toe(s) (acquired), left foot: Secondary | ICD-10-CM | POA: Diagnosis not present

## 2016-07-13 DIAGNOSIS — M79672 Pain in left foot: Secondary | ICD-10-CM

## 2016-07-13 DIAGNOSIS — M79671 Pain in right foot: Secondary | ICD-10-CM

## 2016-07-13 NOTE — Progress Notes (Signed)
Subjective: 56 year old female presents complaining of pain in ball of both feet. Patient is wearing cloth shoes. Stated that she cannot wear anything that go over the toes. Many of her toes are raised up and rubs against shoe in toe box. Patient was seen in 2015 for ankle pain. On feet off and on during the day. Does not work.  Some surgeries done in 2010 - 2011. Had some tendon work to straighten toes. She has had contracted toes that hurt in shoes.   Review of Systems - General ROS: negative for - chills, fatigue, fever, night sweats, sleep disturbance, weight gain or weight loss Ophthalmic ROS: negative ENT ROS: negative Breast ROS: negative for breast lumps Respiratory ROS: no cough, shortness of breath, or wheezing Cardiovascular ROS: no chest pain or dyspnea on exertion Gastrointestinal ROS: no abdominal pain, change in bowel habits, or black or bloody stools Genito-Urinary ROS: no dysuria, trouble voiding, or hematuria Musculoskeletal ROS: Aches from Arthritis in arms, knees, legs, and feet. Has Rheumatoid since 2000. Neurological ROS: no TIA or stroke symptoms Dermatological ROS: negative.  Objective: Dermatologic: Plantar keratotic tissue under the 3rd MPJ area of left foot, painful. Mild plantar callus under right foot. Multiple scar forefoot from previous digital and metatarsal surgeries bilateral.  Vascular: Pedal pulses are not palpable except faint palpation on left Posterior tibial pulse.  Neurologic: Subjective foot pain and leg pain after been on feet a few hours.  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 Enlarged MPJ upon palpation right 2nd MPJ and left 4th MPJ. Stiff joint at previous surgical site, 2nd and 3rd MPJ bilateral. Uneven plantar metatarsophalangeal joints and weight bearing surface bilateral.  Radiographic examination reveal old surgery at metatarsal shaft 2nd and 3rd left foot. Severely elevated first ray, contracted lesser digits left  foot.  Assessment: Multiple digital contracture with pain bilateral. Plantar flexed 3rd metatarsal left with plantar lesion. S/P Multiple foot surgeries.  Plan:  Reviewed clinical findings and available treatment options. Patient wants to have surgical intervention of the deformed joints and digits.  Surgery consent form reviewed for Hammer toe repair with flexor tendon transfer 2nd and 4th left foot.

## 2016-07-13 NOTE — Patient Instructions (Signed)
Seen for painful feet. Painful left foot lesion debrided. Reviewed surgical repair of contracted 2nd and 4th digit left foot. Call for any questions.

## 2016-08-03 ENCOUNTER — Encounter: Payer: Medicaid Other | Admitting: Podiatry

## 2017-12-02 ENCOUNTER — Encounter (HOSPITAL_COMMUNITY): Payer: Self-pay

## 2017-12-02 ENCOUNTER — Inpatient Hospital Stay (HOSPITAL_COMMUNITY)
Admission: EM | Admit: 2017-12-02 | Discharge: 2017-12-05 | DRG: 603 | Disposition: A | Discharge status: Home / Self Care | Payer: Medicaid Other | Attending: Internal Medicine | Admitting: Internal Medicine

## 2017-12-02 ENCOUNTER — Emergency Department (HOSPITAL_COMMUNITY): Payer: Medicaid Other

## 2017-12-02 ENCOUNTER — Other Ambulatory Visit: Payer: Self-pay

## 2017-12-02 DIAGNOSIS — Z7952 Long term (current) use of systemic steroids: Secondary | ICD-10-CM

## 2017-12-02 DIAGNOSIS — E785 Hyperlipidemia, unspecified: Secondary | ICD-10-CM | POA: Diagnosis present

## 2017-12-02 DIAGNOSIS — M069 Rheumatoid arthritis, unspecified: Secondary | ICD-10-CM | POA: Diagnosis present

## 2017-12-02 DIAGNOSIS — D509 Iron deficiency anemia, unspecified: Secondary | ICD-10-CM | POA: Diagnosis present

## 2017-12-02 DIAGNOSIS — M06262 Rheumatoid bursitis, left knee: Secondary | ICD-10-CM | POA: Diagnosis present

## 2017-12-02 DIAGNOSIS — Z7982 Long term (current) use of aspirin: Secondary | ICD-10-CM

## 2017-12-02 DIAGNOSIS — L03116 Cellulitis of left lower limb: Principal | ICD-10-CM | POA: Diagnosis present

## 2017-12-02 DIAGNOSIS — K219 Gastro-esophageal reflux disease without esophagitis: Secondary | ICD-10-CM | POA: Diagnosis present

## 2017-12-02 DIAGNOSIS — Z883 Allergy status to other anti-infective agents status: Secondary | ICD-10-CM

## 2017-12-02 DIAGNOSIS — Z8249 Family history of ischemic heart disease and other diseases of the circulatory system: Secondary | ICD-10-CM

## 2017-12-02 DIAGNOSIS — I10 Essential (primary) hypertension: Secondary | ICD-10-CM | POA: Diagnosis not present

## 2017-12-02 DIAGNOSIS — Z8261 Family history of arthritis: Secondary | ICD-10-CM

## 2017-12-02 DIAGNOSIS — Z87891 Personal history of nicotine dependence: Secondary | ICD-10-CM

## 2017-12-02 DIAGNOSIS — I739 Peripheral vascular disease, unspecified: Secondary | ICD-10-CM | POA: Diagnosis present

## 2017-12-02 DIAGNOSIS — Z79899 Other long term (current) drug therapy: Secondary | ICD-10-CM

## 2017-12-02 DIAGNOSIS — Z882 Allergy status to sulfonamides status: Secondary | ICD-10-CM

## 2017-12-02 LAB — COMPREHENSIVE METABOLIC PANEL
ALK PHOS: 60 U/L (ref 38–126)
ALT: 28 U/L (ref 14–54)
ANION GAP: 10 (ref 5–15)
AST: 26 U/L (ref 15–41)
Albumin: 3.8 g/dL (ref 3.5–5.0)
BILIRUBIN TOTAL: 0.4 mg/dL (ref 0.3–1.2)
BUN: 11 mg/dL (ref 6–20)
CO2: 26 mmol/L (ref 22–32)
CREATININE: 0.71 mg/dL (ref 0.44–1.00)
Calcium: 8.8 mg/dL — ABNORMAL LOW (ref 8.9–10.3)
Chloride: 106 mmol/L (ref 101–111)
GFR calc Af Amer: >60 mL/min (ref >60)
Glucose, Bld: 101 mg/dL — ABNORMAL HIGH (ref 65–99)
Potassium: 3.5 mmol/L (ref 3.5–5.1)
SODIUM: 142 mmol/L (ref 135–145)
TOTAL PROTEIN: 7 g/dL (ref 6.5–8.1)

## 2017-12-02 LAB — CBC WITH DIFFERENTIAL/PLATELET
ABS IMMATURE GRANULOCYTES: 0 10*3/uL (ref 0.0–0.1)
BASOS ABS: 0.1 10*3/uL (ref 0.0–0.1)
BASOS PCT: 1 %
EOS ABS: 0.1 10*3/uL (ref 0.0–0.7)
Eosinophils Relative: 1 %
HCT: 42.7 % (ref 36.0–46.0)
Hemoglobin: 13.5 g/dL (ref 12.0–15.0)
IMMATURE GRANULOCYTES: 0 %
Lymphocytes Relative: 23 %
Lymphs Abs: 2.1 10*3/uL (ref 0.7–4.0)
MCH: 28.2 pg (ref 26.0–34.0)
MCHC: 31.6 g/dL (ref 30.0–36.0)
MCV: 89.3 fL (ref 78.0–100.0)
MONOS PCT: 6 %
Monocytes Absolute: 0.5 10*3/uL (ref 0.1–1.0)
NEUTROS ABS: 6.4 10*3/uL (ref 1.7–7.7)
NEUTROS PCT: 69 %
PLATELETS: 231 10*3/uL (ref 150–400)
RBC: 4.78 MIL/uL (ref 3.87–5.11)
RDW: 14.5 % (ref 11.5–15.5)
WBC: 9.1 10*3/uL (ref 4.0–10.5)

## 2017-12-02 LAB — I-STAT CG4 LACTIC ACID, ED: Lactic Acid, Venous: 0.92 mmol/L (ref 0.5–1.9)

## 2017-12-02 MED ORDER — ASPIRIN EC 81 MG PO TBEC
81.0000 mg | DELAYED_RELEASE_TABLET | Freq: Every day | ORAL | Status: DC
  Administered 2017-12-03: 81 mg via ORAL
  Filled 2017-12-02: qty 1

## 2017-12-02 MED ORDER — METOPROLOL TARTRATE 25 MG PO TABS
25.0000 mg | ORAL_TABLET | Freq: Two times a day (BID) | ORAL | Status: DC
  Administered 2017-12-02 – 2017-12-05 (×6): 25 mg via ORAL
  Filled 2017-12-02 (×6): qty 1

## 2017-12-02 MED ORDER — ZOLPIDEM TARTRATE 5 MG PO TABS: 5.0000 mg | ORAL_TABLET | Freq: Every evening | ORAL | Status: DC | PRN

## 2017-12-02 MED ORDER — DICYCLOMINE HCL 20 MG PO TABS
20.0000 mg | ORAL_TABLET | Freq: Four times a day (QID) | ORAL | Status: DC
  Administered 2017-12-02 – 2017-12-05 (×9): 20 mg via ORAL
  Filled 2017-12-02 (×9): qty 1

## 2017-12-02 MED ORDER — ACETAMINOPHEN 325 MG PO TABS: 650.0000 mg | ORAL_TABLET | Freq: Four times a day (QID) | ORAL | Status: DC | PRN

## 2017-12-02 MED ORDER — ALBUTEROL SULFATE (2.5 MG/3ML) 0.083% IN NEBU: 3.0000 mL | INHALATION_SOLUTION | Freq: Four times a day (QID) | RESPIRATORY_TRACT | Status: DC | PRN

## 2017-12-02 MED ORDER — ACETAMINOPHEN 650 MG RE SUPP: 650.0000 mg | Freq: Four times a day (QID) | RECTAL | Status: DC | PRN

## 2017-12-02 MED ORDER — HYDRALAZINE HCL 20 MG/ML IJ SOLN: 5.0000 mg | INTRAMUSCULAR | Status: DC | PRN

## 2017-12-02 MED ORDER — B COMPLEX-C PO TABS
1.0000 | ORAL_TABLET | Freq: Every day | ORAL | Status: DC
  Administered 2017-12-03 – 2017-12-05 (×3): 1 via ORAL
  Filled 2017-12-02 (×3): qty 1

## 2017-12-02 MED ORDER — ONDANSETRON HCL 4 MG/2ML IJ SOLN: 4.0000 mg | Freq: Four times a day (QID) | INTRAMUSCULAR | Status: DC | PRN

## 2017-12-02 MED ORDER — GABAPENTIN 300 MG PO CAPS
300.0000 mg | ORAL_CAPSULE | Freq: Every day | ORAL | Status: DC
  Administered 2017-12-02 – 2017-12-04 (×3): 300 mg via ORAL
  Filled 2017-12-02 (×3): qty 1

## 2017-12-02 MED ORDER — SODIUM CHLORIDE 0.9 % IV SOLN
1.0000 g | INTRAVENOUS | Status: DC
  Administered 2017-12-02: 1 g via INTRAVENOUS
  Filled 2017-12-02: qty 10

## 2017-12-02 MED ORDER — HEPARIN SODIUM (PORCINE) 5000 UNIT/ML IJ SOLN: 5000.0000 [IU] | Freq: Three times a day (TID) | INTRAMUSCULAR | Status: DC

## 2017-12-02 MED ORDER — ENOXAPARIN SODIUM 40 MG/0.4ML ~~LOC~~ SOLN
40.0000 mg | SUBCUTANEOUS | Status: DC
  Filled 2017-12-02 (×2): qty 0.4

## 2017-12-02 MED ORDER — FERROUS SULFATE 325 (65 FE) MG PO TABS
325.0000 mg | ORAL_TABLET | Freq: Every day | ORAL | Status: DC
  Administered 2017-12-03 – 2017-12-05 (×3): 325 mg via ORAL
  Filled 2017-12-02 (×3): qty 1

## 2017-12-02 MED ORDER — CLINDAMYCIN PHOSPHATE 600 MG/50ML IV SOLN
600.0000 mg | Freq: Once | INTRAVENOUS | Status: AC
  Administered 2017-12-02: 600 mg via INTRAVENOUS
  Filled 2017-12-02: qty 50

## 2017-12-02 MED ORDER — OXYCODONE-ACETAMINOPHEN 5-325 MG PO TABS: 1.0000 | ORAL_TABLET | ORAL | Status: DC | PRN

## 2017-12-02 MED ORDER — PANTOPRAZOLE SODIUM 40 MG PO TBEC
40.0000 mg | DELAYED_RELEASE_TABLET | Freq: Every day | ORAL | Status: DC
  Administered 2017-12-03 – 2017-12-05 (×3): 40 mg via ORAL
  Filled 2017-12-02 (×3): qty 1

## 2017-12-02 MED ORDER — ROSUVASTATIN CALCIUM 20 MG PO TABS
20.0000 mg | ORAL_TABLET | Freq: Every evening | ORAL | Status: DC
  Administered 2017-12-02 – 2017-12-04 (×3): 20 mg via ORAL
  Filled 2017-12-02 (×3): qty 1

## 2017-12-02 MED ORDER — HYDROCORTISONE NA SUCCINATE PF 100 MG IJ SOLR
50.0000 mg | Freq: Once | INTRAMUSCULAR | Status: AC
  Administered 2017-12-02: 50 mg via INTRAVENOUS
  Filled 2017-12-02: qty 2

## 2017-12-02 MED ORDER — VITAMIN D 1000 UNITS PO TABS
1000.0000 [IU] | ORAL_TABLET | Freq: Every day | ORAL | Status: DC
  Administered 2017-12-03 – 2017-12-05 (×3): 1000 [IU] via ORAL
  Filled 2017-12-02 (×3): qty 1

## 2017-12-02 MED ORDER — IBUPROFEN 800 MG PO TABS: 800.0000 mg | ORAL_TABLET | Freq: Three times a day (TID) | ORAL | Status: DC | PRN

## 2017-12-02 MED ORDER — IBUPROFEN 800 MG PO TABS
800.0000 mg | ORAL_TABLET | Freq: Three times a day (TID) | ORAL | Status: DC | PRN
  Filled 2017-12-02: qty 1

## 2017-12-02 MED ORDER — MIRABEGRON ER 50 MG PO TB24
50.0000 mg | ORAL_TABLET | Freq: Every day | ORAL | Status: DC
  Filled 2017-12-02 (×3): qty 1

## 2017-12-02 MED ORDER — PREDNISONE 5 MG PO TABS
5.0000 mg | ORAL_TABLET | Freq: Every day | ORAL | Status: DC
  Administered 2017-12-03 – 2017-12-05 (×3): 5 mg via ORAL
  Filled 2017-12-02 (×3): qty 1

## 2017-12-02 MED ORDER — ONDANSETRON HCL 4 MG PO TABS: 4.0000 mg | ORAL_TABLET | Freq: Four times a day (QID) | ORAL | Status: DC | PRN

## 2017-12-02 MED ORDER — VITAMIN B-12 100 MCG PO TABS
100.0000 ug | ORAL_TABLET | Freq: Every day | ORAL | Status: DC
  Administered 2017-12-03 – 2017-12-04 (×2): 100 ug via ORAL
  Filled 2017-12-02 (×2): qty 1

## 2017-12-02 NOTE — H&P (Signed)
History and Physical    Anna Carlson RXV:[id number] DOB: 08/31/1960 DOA: 12/02/2017  Referring MD/NP/PA:   PCP: Benito Mccreedy, MD   Patient coming from:  The patient is coming from home.  At baseline, pt is independent for most of ADL.        Chief Complaint: left lower knee and left pain  HPI: Anna Carlson is a 57 y.o. female with medical history significant of RA (on prednisone and Orencia injection), hypertension, hyperlipidemia, GERD, PVD, iron deficiency anemia, who presents with left lower knee and leg pain.  Pt states that she began having redness, pain and swelling on L leg close to knee joint 2 weeks ago. She was seen at [facility] and started on course of keflex for 7 days.  She had no improvement with antibiotics and returned to urgent care where they extended the course of keflex to 14 days. Still no significant improvement.  In the past several days, the redness and swelling have extended down to her lower leg, close to her ankle.  The pain is constant, 7 out of 10 in severity, sharp, nonradiating.  Patient does not have fever or chills. She states that she has nausea, no vomiting or abdominal pain.  She states she has intermittent mild diarrhea recently.  No symptoms of UTI or unilateral weakness.  ED Course: pt was found to have WBC 9.1, lactic acid of 0.92, electrolytes renal function okay, no tachycardia, no tachypnea, temperature normal, oxygen saturation 100% on room air.  X-ray of left knee show degenerative change without bony fracture, no appreciable joint effusion and adjacent soft tissues are unremarkable. Pt is placed on med-surg bed for obs.  Review of Systems:   General: no fevers, chills, no body weight gain, has fatigue HEENT: no blurry vision, hearing changes or sore throat Respiratory: no dyspnea, coughing, wheezing CV: no chest pain, no palpitations GI: has nausea, no vomiting, abdominal pain, has diarrhea, no constipation GU: no dysuria, burning on  urination, increased urinary frequency, hematuria  Ext: has left leg swelling and pain Neuro: no unilateral weakness, numbness, or tingling, no vision change or hearing loss Skin: has two red rashes in right knee area. no skin tear. MSK: No muscle spasm, no deformity, no limitation of range of movement in spin Heme: No easy bruising.  Travel history: No recent long distant travel.  Allergy:  Allergies  Allergen Reactions  . Sulfa Antibiotics Swelling  . Leflunomide Rash    Mouth ulcers  Rash at injection site    Past Medical History:  Diagnosis Date  . Angina   . Arthritis   . Bilateral leg pain   . Hypertension   . PVD (peripheral vascular disease) ([place]) 11/28/2013    Past Surgical History:  Procedure Laterality Date  . ARTHROSCOPY KNEE W/ DRILLING Right 2008  . foot surgery Bilateral 2008,2009    Social History:  reports that she has quit smoking. She has never used smokeless tobacco. She reports that she does not drink alcohol or use drugs.  Family History:  Family History  Problem Relation Age of Onset  . Diabetes Mother   . Hypertension Mother   . Arthritis Mother   . Cataracts Mother   . Hyperlipidemia Father   . Diabetes Father   . Heart attack Father   . Diabetes type I Daughter      Prior to Admission medications   Medication Sig Start Date End Date Taking? Authorizing Provider  Abatacept (ORENCIA) 125 MG/ML SOSY Inject 1  Syringe into the skin once a week. 11/16/17  Yes [provider]  albuterol (PROAIR HFA) 108 (90 BASE) MCG/ACT inhaler Inhale 1-2 puffs into the lungs every 6 (six) hours as needed for wheezing or shortness of breath.   Yes [provider]  aspirin EC 81 MG tablet Take 1 tablet (81 mg total) by mouth daily. 10/05/15  Yes Penumalli, Earlean Polka, MD  b complex vitamins tablet Take 1 tablet by mouth daily.   Yes [provider]  cephALEXin (KEFLEX) 500 MG capsule Take 500 mg by mouth 4 (four) times daily. For 14 days  11/21/17 12/05/17 Yes [provider]  cholecalciferol (VITAMIN D) 1000 UNITS tablet Take 1,000 Units by mouth daily. 05/05/14  Yes [provider]  cyanocobalamin 100 MCG tablet Take 100 mcg by mouth daily.   Yes [provider]  dicyclomine (BENTYL) 20 MG tablet Take 20 mg by mouth every 6 (six) hours.   Yes [provider]  ferrous sulfate 325 (65 FE) MG tablet Take 325 mg by mouth daily with breakfast.   Yes [provider]  furosemide (LASIX) 20 MG tablet Take 20 mg by mouth daily.   Yes [provider]  gabapentin (NEURONTIN) 300 MG capsule Take 300 mg by mouth at bedtime.    Yes [provider]  ibuprofen (ADVIL,MOTRIN) 800 MG tablet TAKE 1 TABLET BY MOUTH EVERY 8 HOURS AS NEEDED Patient taking differently: TAKE 1 TABLET BY MOUTH EVERY 8 HOURS AS NEEDED FOR PAIN 09/17/15  Yes Shelly Bombard, MD  metoprolol tartrate (LOPRESSOR) 25 MG tablet Take 25 mg by mouth 2 (two) times daily.  09/22/15  Yes [provider]  MYRBETRIQ 50 MG TB24 tablet Take 50 mg by mouth daily. 11/07/17  Yes [provider]  omeprazole (PRILOSEC) 40 MG capsule Take 40 mg by mouth daily as needed (heartburn).  05/27/14  Yes [provider]  predniSONE (DELTASONE) 5 MG tablet Take 5 mg by mouth daily.  05/27/14  Yes [provider]  rosuvastatin (CRESTOR) 20 MG tablet Take 20 mg by mouth every evening. 10/11/17  Yes [provider]  TOVIAZ 4 MG TB24 tablet Take 4 mg by mouth daily. 11/16/17  Yes [provider]    Physical Exam: Vitals:   12/02/17 1920 12/02/17 2003  BP: (!) 148/93   Pulse: 73   Resp: 18   Temp: 98.2 F (36.8 C)   TempSrc: Oral   SpO2: 100%   Weight:  72.6 kg (160 lb)  Height:  '5\' 2"'$  (1.575 m)   General: Not in acute distress HEENT:       Eyes: PERRL, EOMI, no scleral icterus.       ENT: No discharge from the ears and nose, no pharynx injection, no tonsillar enlargement.        Neck:  No JVD, no bruit, no mass felt. Heme: No neck lymph node enlargement. Cardiac: S1/S2, RRR, No murmurs, No gallops or rubs. Respiratory: No rales, wheezing, rhonchi or rubs. GI: Soft, nondistended, nontender, no rebound pain, no organomegaly, BS present. GU: No hematuria Ext: has left edema and pain. 2+DP/PT pulse bilaterally. Musculoskeletal: No joint deformities, No joint redness or warmth, no limitation of ROM in spin. Skin: has redness, swelling and warmth from left lower knee joint to ankle area. Has two red rashes in right knee area. Neuro: Alert, oriented X3, cranial nerves II-XII grossly intact, moves all extremities normally.  Psych: Patient is not psychotic, no suicidal or hemocidal ideation.  Labs on Admission: I have personally reviewed following labs and imaging studies  CBC: Recent Labs  Lab 12/02/17 1930  WBC 9.1  NEUTROABS 6.4  HGB 13.5  HCT 42.7  MCV 89.3  PLT 338   Basic Metabolic Panel: Recent Labs  Lab 12/02/17 1930  NA 142  K 3.5  CL 106  CO2 26  GLUCOSE 101*  BUN 11  CREATININE 0.71  CALCIUM 8.8*   GFR: Estimated Creatinine Clearance: 75 mL/min (by C-G formula based on SCr of 0.71 mg/dL). Liver Function Tests: Recent Labs  Lab 12/02/17 1930  AST 26  ALT 28  ALKPHOS 60  BILITOT 0.4  PROT 7.0  ALBUMIN 3.8   No results for input(s): LIPASE, AMYLASE in the last 168 hours. No results for input(s): AMMONIA in the last 168 hours. Coagulation Profile: No results for input(s): INR, PROTIME in the last 168 hours. Cardiac Enzymes: No results for input(s): CKTOTAL, CKMB, CKMBINDEX, TROPONINI in the last 168 hours. BNP (last 3 results) No results for input(s): PROBNP in the last 8760 hours. HbA1C: No results for input(s): HGBA1C in the last 72 hours. CBG: No results for input(s): GLUCAP in the last 168 hours. Lipid Profile: No results for input(s): CHOL, HDL, LDLCALC, TRIG, CHOLHDL, LDLDIRECT in the last 72 hours. Thyroid Function Tests: No  results for input(s): TSH, T4TOTAL, FREET4, T3FREE, THYROIDAB in the last 72 hours. Anemia Panel: No results for input(s): VITAMINB12, FOLATE, FERRITIN, TIBC, IRON, RETICCTPCT in the last 72 hours. Urine analysis:    Component Value Date/Time   COLORURINE YELLOW 02/06/2015 2154   APPEARANCEUR CLEAR 02/06/2015 2154   LABSPEC 1.017 02/06/2015 2154   LABSPEC 1.030 04/05/2007 0949   PHURINE 5.5 02/06/2015 2154   GLUCOSEU NEGATIVE 02/06/2015 2154   HGBUR NEGATIVE 02/06/2015 2154   BILIRUBINUR neg 09/21/2015 1206   BILIRUBINUR Negative 04/05/2007 [place] 02/06/2015 2154   PROTEINUR neg 09/21/2015 [place] 02/06/2015 2154   UROBILINOGEN negative 09/21/2015 1206   UROBILINOGEN 0.2 02/06/2015 2154   NITRITE neg 09/21/2015 1206   NITRITE NEGATIVE 02/06/2015 2154   LEUKOCYTESUR Negative 09/21/2015 1206   LEUKOCYTESUR Trace 04/05/2007 0949   Sepsis Labs: '@LABRCNTIP'$ (procalcitonin:4,lacticidven:4) )No results found for this or any previous visit (from the past 240 hour(s)).   Radiological Exams on Admission: Dg Knee Complete 4 Views Left  Result Date: 12/02/2017 CLINICAL DATA:  Pain for couple of weeks, no known injury. Redness and swelling. EXAM: LEFT KNEE - COMPLETE 4+ VIEW COMPARISON:  Plain film of the LEFT knee dated 05/11/2008. FINDINGS: Mild tricompartmental degenerative joint space narrowing, similar to previous plain film of 05/11/2008. No acute or suspicious osseous lesion. No fracture line or displaced fracture fragment. No appreciable joint effusion and adjacent soft tissues are unremarkable. IMPRESSION: 1. No acute findings. 2. Mild tricompartmental degenerative joint space narrowing, similar to previous study of 05/11/2008. No large osteophytes or other secondary signs of advanced DJD. Electronically Signed   By: Franki Cabot M.D.   On: 12/02/2017 19:47     EKG:  Not done in ED, will get one.   Assessment/Plan Principal Problem:   Cellulitis  of left leg Active Problems:   ANEMIA, IRON DEFICIENCY   Rheumatoid arthritis (HCC)   Essential hypertension   HLD (hyperlipidemia)   Cellulitis of left leg: The erythema and swelling are not involving calf area, less likely to have DVT.  Patient failed outpatient oral antibiotic treatment.  Symptoms are worsening.  No fever or leukocytosis.  Lactic  acid normal.  Clinically not septic.  Hemodynamically stable.  X-ray did not show joint effusion, does not seem to have septic joint on examination.  - will place on tele bed for obs - [facility] antimicrobial treatment with IV Rocephin (received 1 dose of clindamycin in ED) - PRN Zofran for nausea, Percocet for pain - Blood cultures x 2  - ESR and CRP  ANEMIA, IRON DEFICIENCY:  -Continue iron supplement  Rheumatoid arthritis ([place]): on weekly Orencia injection and prednisone 5 mg daily -hold off Orencia  -Continue prednisone -continue Ibuprofen --Give 1 dose of Solu-Cortef 50 mg x 1 as stress dose -Check cortisol level  Essential hypertension: -Continue metoprolol -IV hydralazine as needed -Hold Lasix since patient is at risk of developing sepsis  HLD (hyperlipidemia): -Crestor    DVT ppx: SQ Lovenox Code Status: full code Family Communication:  Yes, patient's friend  at bed side Disposition Plan:  Anticipate discharge back to previous home environment Consults called:  none Admission status:    medical floor/obs      Date of Service 12/02/2017    Ivor Costa [facility] Pager [phone]  If 7PM-7AM, please contact night-coverage [email or web address] Password [facility] 12/02/2017, 9:55 PM

## 2017-12-02 NOTE — ED Provider Notes (Signed)
[facility] EMERGENCY DEPARTMENT Provider Note   CSN: [id number] Arrival date & time: 12/02/17  1913     History   Chief Complaint Chief Complaint  Patient presents with  . Leg Swelling    HPI Anna Carlson is a 57 y.o. female.  57yo F w/ PMH including HTN, RA on immunosuppression, PVD who p/w L leg pain and swelling. 2 weeks ago, she began having redness, pain and swelling on L leg near knee. She was seen at [facility] and started on course of keflex.  She had no improvement with antibiotics and return to urgent care where she was started on a second course of antibiotics.  Over the past few days, the redness and swelling have extended down her leg and she has more swelling in her foot and ankle.  She reports moderate pain that is worse when she tries to walk.  She was not able to sleep well last night due to pain.  No fevers, trauma, or wound. She is on prednisone daily and Abatacept for RA.   The history is provided by the patient.    Past Medical History:  Diagnosis Date  . Angina   . Arthritis   . Bilateral leg pain   . Hypertension   . PVD (peripheral vascular disease) ([place]) 11/28/2013    Patient Active Problem List   Diagnosis Date Noted  . Bilateral leg pain 01/22/2014  . Pain in lower limb 11/28/2013  . PVD (peripheral vascular disease) ([place]) 11/28/2013  . Metatarsal deformity 11/28/2013  . Other hammer toe (acquired) 11/28/2013  . Arthritis 11/26/2012  . Menopause present 11/08/2012  . Unspecified symptom associated with female genital organs 11/08/2012  . Postmenopausal bleeding 11/08/2012  . Chest pain 09/16/2011  . ANEMIA, IRON DEFICIENCY 08/29/2007  . HYPERTENSION 08/29/2007  . MENORRHAGIA 08/29/2007  . ARTHRITIS, RHEUMATOID 08/29/2007  . BLOOD IN STOOL, OCCULT 08/29/2007  . LYME DISEASE, HX OF 08/29/2007  . FIBROIDS, UTERUS 02/14/2007  . HEMORRHOIDS, INTERNAL 01/13/2007  . GASTRITIS 01/12/2007    Past Surgical History:  Procedure  Laterality Date  . ARTHROSCOPY KNEE W/ DRILLING Right 2008  . foot surgery Bilateral 2008,2009     OB History   None      Home Medications    Prior to Admission medications   Medication Sig Start Date End Date Taking? Authorizing Provider  Abatacept (ORENCIA) 125 MG/ML SOSY Inject 1 Syringe into the skin once a week. 11/16/17  Yes [provider]  albuterol (PROAIR HFA) 108 (90 BASE) MCG/ACT inhaler Inhale 1-2 puffs into the lungs every 6 (six) hours as needed for wheezing or shortness of breath.   Yes [provider]  aspirin EC 81 MG tablet Take 1 tablet (81 mg total) by mouth daily. 10/05/15  Yes Penumalli, Earlean Polka, MD  b complex vitamins tablet Take 1 tablet by mouth daily.   Yes [provider]  cephALEXin (KEFLEX) 500 MG capsule Take 500 mg by mouth 4 (four) times daily. For 14 days 11/21/17 12/05/17 Yes [provider]  cholecalciferol (VITAMIN D) 1000 UNITS tablet Take 1,000 Units by mouth daily. 05/05/14  Yes [provider]  cyanocobalamin 100 MCG tablet Take 100 mcg by mouth daily.   Yes [provider]  dicyclomine (BENTYL) 20 MG tablet Take 20 mg by mouth every 6 (six) hours.   Yes [provider]  ferrous sulfate 325 (65 FE) MG tablet Take 325 mg by mouth daily with breakfast.   Yes [provider]  furosemide (LASIX) 20 MG tablet Take 20 mg by mouth daily.   Yes [provider]  gabapentin (NEURONTIN) 300 MG capsule Take 300 mg by mouth at bedtime.    Yes [provider]  ibuprofen (ADVIL,MOTRIN) 800 MG tablet TAKE 1 TABLET BY MOUTH EVERY 8 HOURS AS NEEDED Patient taking differently: TAKE 1 TABLET BY MOUTH EVERY 8 HOURS AS NEEDED FOR PAIN 09/17/15  Yes Shelly Bombard, MD  metoprolol tartrate (LOPRESSOR) 25 MG tablet Take 25 mg by mouth 2 (two) times daily.  09/22/15  Yes [provider]  MYRBETRIQ 50 MG TB24 tablet Take 50 mg by mouth daily. 11/07/17  Yes [provider]    omeprazole (PRILOSEC) 40 MG capsule Take 40 mg by mouth daily as needed (heartburn).  05/27/14  Yes [provider]  predniSONE (DELTASONE) 5 MG tablet Take 5 mg by mouth daily.  05/27/14  Yes [provider]  rosuvastatin (CRESTOR) 20 MG tablet Take 20 mg by mouth every evening. 10/11/17  Yes [provider]  TOVIAZ 4 MG TB24 tablet Take 4 mg by mouth daily. 11/16/17  Yes [provider]    Family History Family History  Problem Relation Age of Onset  . Diabetes Mother   . Hypertension Mother   . Arthritis Mother   . Cataracts Mother   . Hyperlipidemia Father   . Diabetes Father   . Heart attack Father   . Diabetes type I Daughter     Social History Social History   Tobacco Use  . Smoking status: Former Research scientist (life sciences)  . Smokeless tobacco: Never Used  . Tobacco comment: smoked as teen, quit years ago  Substance Use Topics  . Alcohol use: No    Alcohol/week: 0.0 oz    Comment: socially  . Drug use: No     Allergies   Sulfa antibiotics and Leflunomide   Review of Systems Review of Systems All other systems reviewed and are negative except that which was mentioned in HPI   Physical Exam Updated Vital Signs BP (!) 148/93 (BP Location: Right Arm)   Pulse 73   Temp 98.2 F (36.8 C) (Oral)   Resp 18   Ht 5\' 2"  (1.575 m)   Wt 72.6 kg (160 lb)   LMP 10/24/2012   SpO2 100%   BMI 29.26 kg/m   Physical Exam  Constitutional: She is oriented to person, place, and time. She appears well-developed and well-nourished. No distress.  HENT:  Head: Normocephalic and atraumatic.  Moist mucous membranes  Eyes: Conjunctivae are normal.  Neck: Neck supple.  Cardiovascular: Normal rate, regular rhythm, normal heart sounds and intact distal pulses.  No murmur heard. Pulmonary/Chest: Effort normal and breath sounds normal.  Abdominal: Soft. Bowel sounds are normal. She exhibits no distension. There is no tenderness.  Musculoskeletal: She exhibits  edema and tenderness.  Edema extending from left knee through foot with mild tenderness along anterior lower leg  Neurological: She is alert and oriented to person, place, and time.  Fluent speech  Skin: Skin is warm and dry. There is erythema.  Warmth, erythema from distal L knee down anterior leg; no fluctuance or wounds  Psychiatric: She has a normal mood and affect. Judgment normal.  Nursing note and vitals reviewed.      ED Treatments / Results  Labs (all labs ordered are listed, but only abnormal results are displayed) Labs Reviewed  COMPREHENSIVE METABOLIC PANEL - Abnormal; Notable for the following components:  Result Value   Glucose, Bld 101 (*)    Calcium 8.8 (*)    All other components within normal limits  CULTURE, BLOOD (ROUTINE X 2)  CULTURE, BLOOD (ROUTINE X 2)  CBC WITH DIFFERENTIAL/PLATELET  I-STAT CG4 LACTIC ACID, ED    EKG None  Radiology Dg Knee Complete 4 Views Left  Result Date: 12/02/2017 CLINICAL DATA:  Pain for couple of weeks, no known injury. Redness and swelling. EXAM: LEFT KNEE - COMPLETE 4+ VIEW COMPARISON:  Plain film of the LEFT knee dated 05/11/2008. FINDINGS: Mild tricompartmental degenerative joint space narrowing, similar to previous plain film of 05/11/2008. No acute or suspicious osseous lesion. No fracture line or displaced fracture fragment. No appreciable joint effusion and adjacent soft tissues are unremarkable. IMPRESSION: 1. No acute findings. 2. Mild tricompartmental degenerative joint space narrowing, similar to previous study of 05/11/2008. No large osteophytes or other secondary signs of advanced DJD. Electronically Signed   By: Franki Cabot M.D.   On: 12/02/2017 19:47    Procedures Procedures (including critical care time)  Medications Ordered in ED Medications  clindamycin (CLEOCIN) IVPB 600 mg (has no administration in time range)     Initial Impression / Assessment and Plan / ED Course  I have reviewed the triage  vital signs and the nursing notes.  Pertinent labs & imaging results that were available during my care of the patient were reviewed by me and considered in my medical decision making (see chart for details).     Patient was afebrile with reassuring vital signs.  Exam is consistent with cellulitis.  Bedside ultrasound shows cobblestoning, no fluid collection.  Based on review of her images on her phone of what the area initially looked like, she has had progression of the cellulitis to a larger area.  Based on progression and current appearance I feel DVT is unlikely cause of symptoms.  Plain films reassuring.  Lab work shows normal WBC count.  I am concerned about the patient's worsening infection in the setting of immunosuppression and failed courses of antibiotics.  Gave the IV clindamycin and recommended admission. Discussed w/ Dr. Blaine Hamper and pt admitted for further treatment. Stable VS with no signs/sx of sepsis.  Final Clinical Impressions(s) / ED Diagnoses   Final diagnoses:  None    ED Discharge Orders    None       Khian Remo, Wenda Overland, MD 12/02/17 2132

## 2017-12-02 NOTE — ED Triage Notes (Signed)
Pt states that for the past two weeks she had swelling to her L knee with redness, placed on antibiotic and it did not get better, completed second round and it is still not better, redness and swelling now goes down her leg to her ankle. Denies fevers.

## 2017-12-03 ENCOUNTER — Other Ambulatory Visit: Payer: Self-pay

## 2017-12-03 DIAGNOSIS — M069 Rheumatoid arthritis, unspecified: Secondary | ICD-10-CM | POA: Diagnosis present

## 2017-12-03 DIAGNOSIS — I739 Peripheral vascular disease, unspecified: Secondary | ICD-10-CM | POA: Diagnosis present

## 2017-12-03 DIAGNOSIS — L03116 Cellulitis of left lower limb: Secondary | ICD-10-CM | POA: Diagnosis not present

## 2017-12-03 DIAGNOSIS — Z7982 Long term (current) use of aspirin: Secondary | ICD-10-CM | POA: Diagnosis not present

## 2017-12-03 DIAGNOSIS — M06262 Rheumatoid bursitis, left knee: Secondary | ICD-10-CM | POA: Diagnosis present

## 2017-12-03 DIAGNOSIS — Z8249 Family history of ischemic heart disease and other diseases of the circulatory system: Secondary | ICD-10-CM | POA: Diagnosis not present

## 2017-12-03 DIAGNOSIS — Z883 Allergy status to other anti-infective agents status: Secondary | ICD-10-CM | POA: Diagnosis not present

## 2017-12-03 DIAGNOSIS — I10 Essential (primary) hypertension: Secondary | ICD-10-CM | POA: Diagnosis not present

## 2017-12-03 DIAGNOSIS — Z79899 Other long term (current) drug therapy: Secondary | ICD-10-CM | POA: Diagnosis not present

## 2017-12-03 DIAGNOSIS — Z8261 Family history of arthritis: Secondary | ICD-10-CM | POA: Diagnosis not present

## 2017-12-03 DIAGNOSIS — K219 Gastro-esophageal reflux disease without esophagitis: Secondary | ICD-10-CM | POA: Diagnosis present

## 2017-12-03 DIAGNOSIS — Z882 Allergy status to sulfonamides status: Secondary | ICD-10-CM | POA: Diagnosis not present

## 2017-12-03 DIAGNOSIS — Z7952 Long term (current) use of systemic steroids: Secondary | ICD-10-CM | POA: Diagnosis not present

## 2017-12-03 DIAGNOSIS — D509 Iron deficiency anemia, unspecified: Secondary | ICD-10-CM

## 2017-12-03 DIAGNOSIS — E785 Hyperlipidemia, unspecified: Secondary | ICD-10-CM | POA: Diagnosis present

## 2017-12-03 DIAGNOSIS — Z87891 Personal history of nicotine dependence: Secondary | ICD-10-CM | POA: Diagnosis not present

## 2017-12-03 LAB — CBC
HCT: 40.1 % (ref 36.0–46.0)
Hemoglobin: 12.6 g/dL (ref 12.0–15.0)
MCH: 28.4 pg (ref 26.0–34.0)
MCHC: 31.4 g/dL (ref 30.0–36.0)
MCV: 90.5 fL (ref 78.0–100.0)
PLATELETS: 221 10*3/uL (ref 150–400)
RBC: 4.43 MIL/uL (ref 3.87–5.11)
RDW: 14.6 % (ref 11.5–15.5)
WBC: 7.9 10*3/uL (ref 4.0–10.5)

## 2017-12-03 LAB — BASIC METABOLIC PANEL
Anion gap: 6 (ref 5–15)
BUN: 10 mg/dL (ref 6–20)
CALCIUM: 8.5 mg/dL — AB (ref 8.9–10.3)
CO2: 26 mmol/L (ref 22–32)
CREATININE: 0.65 mg/dL (ref 0.44–1.00)
Chloride: 111 mmol/L (ref 101–111)
GFR calc non Af Amer: >60 mL/min (ref >60)
GLUCOSE: 126 mg/dL — AB (ref 65–99)
Potassium: 3.9 mmol/L (ref 3.5–5.1)
Sodium: 143 mmol/L (ref 135–145)

## 2017-12-03 LAB — HIV ANTIBODY (ROUTINE TESTING W REFLEX): HIV Screen 4th Generation wRfx: NONREACTIVE

## 2017-12-03 LAB — C-REACTIVE PROTEIN: CRP: 1 mg/dL — AB (ref <1.0)

## 2017-12-03 LAB — CORTISOL-AM, BLOOD: CORTISOL - AM: 6.9 ug/dL (ref 6.7–22.6)

## 2017-12-03 LAB — SEDIMENTATION RATE: SED RATE: 10 mm/h (ref 0–22)

## 2017-12-03 MED ORDER — SODIUM CHLORIDE 0.9 % IV SOLN
100.0000 mg | Freq: Two times a day (BID) | INTRAVENOUS | Status: DC
  Administered 2017-12-03 – 2017-12-04 (×4): 100 mg via INTRAVENOUS
  Filled 2017-12-03 (×5): qty 100

## 2017-12-03 NOTE — Progress Notes (Addendum)
Progress Note    Anna Carlson  BTD:[id number] DOB: 1960/11/13  DOA: 12/02/2017 PCP: Benito Mccreedy, MD    Brief Narrative:    Medical records reviewed and are as summarized below:  Anna Carlson is an 57 y.o. female with medical history significant of RA (on prednisone and Orencia injection), hypertension, hyperlipidemia, GERD, PVD, iron deficiency anemia, who presents with left lower knee and leg pain.  Pt states that she began having redness, pain and swelling on L leg close to knee joint 2 weeks ago. She was seen at [facility] and started on course of keflex for 7 days. She had no improvement with antibiotics and returned to urgent care where they extended the course of keflex to 14 days. Still no significant improvement.  In the past several days, the redness and swelling have extended down to her lower leg, close to her ankle.  The pain is constant, 7 out of 10 in severity, sharp, nonradiating.     Assessment/Plan:   Principal Problem:   Cellulitis of left leg Active Problems:   ANEMIA, IRON DEFICIENCY   Rheumatoid arthritis (HCC)   Essential hypertension   HLD (hyperlipidemia)  Cellulitis of left leg/bursitis?:  -failed outpatient oral antibiotic treatment x 14 days of keflex-- change to doxy and broaden as needed -X-ray did not show joint effusion but does have severe arthritis - Blood cultures x 2  -ortho consult appreciated- ? Steroids vs tap? -not consistent with gout  ANEMIA, IRON DEFICIENCY:  -Continue iron supplement  Rheumatoid arthritis ([place]): on weekly Orencia injection and prednisone 5 mg daily -hold off Orencia  -Continue prednisone -continue Ibuprofen  Essential hypertension: -Continue metoprolol -IV hydralazine as needed -resume lasix in AM  HLD (hyperlipidemia): -Crestor     Family Communication/Anticipated D/C date and plan/Code Status   DVT prophylaxis: Lovenox ordered. Code Status: Full Code.  Family Communication:  daughter at bedside Disposition Plan:    Medical Consultants:    ortho    Subjective:   Minimal improvement in redness and pain  Objective:    Vitals:   12/02/17 2254 12/03/17 0453 12/03/17 0954 12/03/17 1336  BP: (!) 143/84 (!) 102/57 125/68 115/65  Pulse: 82 62 79 69  Resp: 16 16  18   Temp: 98.1 F (36.7 C) 97.9 F (36.6 C)  98.1 F (36.7 C)  TempSrc:  Oral  Oral  SpO2: 100% 100%  100%  Weight:      Height:        Intake/Output Summary (Last 24 hours) at 12/03/2017 1409 Last data filed at 12/03/2017 0110 Gross per 24 hour  Intake 316.67 ml  Output -  Net 316.67 ml   Filed Weights   12/02/17 2003 12/02/17 2251  Weight: 72.6 kg (160 lb) 73.8 kg (162 lb 11.2 oz)    Exam: Left knee with mildly swollen area with redness moving down toward ankle rrr NAD, pleasant/cooperative No rashes A+Ox3  Data Reviewed:   I have personally reviewed following labs and imaging studies:  Labs: Labs show the following:   Basic Metabolic Panel: Recent Labs  Lab 12/02/17 1930 12/03/17 0527  NA 142 143  K 3.5 3.9  CL 106 111  CO2 26 26  GLUCOSE 101* 126*  BUN 11 10  CREATININE 0.71 0.65  CALCIUM 8.8* 8.5*   GFR Estimated Creatinine Clearance: 77.4 mL/min (by C-G formula based on SCr of 0.65 mg/dL). Liver Function Tests: Recent Labs  Lab 12/02/17 1930  AST 26  ALT 28  ALKPHOS  60  BILITOT 0.4  PROT 7.0  ALBUMIN 3.8   No results for input(s): LIPASE, AMYLASE in the last 168 hours. No results for input(s): AMMONIA in the last 168 hours. Coagulation profile No results for input(s): INR, PROTIME in the last 168 hours.  CBC: Recent Labs  Lab 12/02/17 1930 12/03/17 0527  WBC 9.1 7.9  NEUTROABS 6.4  --   HGB 13.5 12.6  HCT 42.7 40.1  MCV 89.3 90.5  PLT 231 221   Cardiac Enzymes: No results for input(s): CKTOTAL, CKMB, CKMBINDEX, TROPONINI in the last 168 hours. BNP (last 3 results) No results for input(s): PROBNP in the last 8760 hours. CBG: No  results for input(s): GLUCAP in the last 168 hours. D-Dimer: No results for input(s): DDIMER in the last 72 hours. Hgb A1c: No results for input(s): HGBA1C in the last 72 hours. Lipid Profile: No results for input(s): CHOL, HDL, LDLCALC, TRIG, CHOLHDL, LDLDIRECT in the last 72 hours. Thyroid function studies: No results for input(s): TSH, T4TOTAL, T3FREE, THYROIDAB in the last 72 hours.  Invalid input(s): FREET3 Anemia work up: No results for input(s): VITAMINB12, FOLATE, FERRITIN, TIBC, IRON, RETICCTPCT in the last 72 hours. Sepsis Labs: Recent Labs  Lab 12/02/17 1930 12/02/17 1938 12/03/17 0527  WBC 9.1  --  7.9  LATICACIDVEN  --  0.92  --     Microbiology Recent Results (from the past 240 hour(s))  Culture, blood (routine x 2)     Status: None (Preliminary result)   Collection Time: 12/02/17  8:30 PM  Result Value Ref Range Status   Specimen Description BLOOD RIGHT FOREARM  Final   Special Requests   Final    BOTTLES DRAWN AEROBIC AND ANAEROBIC Blood Culture adequate volume   Culture   Final    NO GROWTH < 24 HOURS Performed at [facility], [address], [place], [place] [address]    Report Status PENDING  Incomplete  Culture, blood (routine x 2)     Status: None (Preliminary result)   Collection Time: 12/02/17  8:46 PM  Result Value Ref Range Status   Specimen Description BLOOD LEFT HAND  Final   Special Requests   Final    BOTTLES DRAWN AEROBIC AND ANAEROBIC Blood Culture adequate volume   Culture   Final    NO GROWTH < 24 HOURS Performed at [facility], [place] [address], [place], [place] [address]    Report Status PENDING  Incomplete    Procedures and diagnostic studies:  Dg Knee Complete 4 Views Left  Result Date: 12/02/2017 CLINICAL DATA:  Pain for couple of weeks, no known injury. Redness and swelling. EXAM: LEFT KNEE - COMPLETE 4+ VIEW COMPARISON:  Plain film of the LEFT knee dated 05/11/2008. FINDINGS: Mild tricompartmental degenerative  joint space narrowing, similar to previous plain film of 05/11/2008. No acute or suspicious osseous lesion. No fracture line or displaced fracture fragment. No appreciable joint effusion and adjacent soft tissues are unremarkable. IMPRESSION: 1. No acute findings. 2. Mild tricompartmental degenerative joint space narrowing, similar to previous study of 05/11/2008. No large osteophytes or other secondary signs of advanced DJD. Electronically Signed   By: Franki Cabot M.D.   On: 12/02/2017 19:47    Medications:   . aspirin EC  81 mg Oral Daily  . B-complex with vitamin C  1 tablet Oral Daily  . cholecalciferol  1,000 Units Oral Daily  . dicyclomine  20 mg Oral Q6H  . enoxaparin (LOVENOX) injection  40 mg Subcutaneous Q24H  . ferrous sulfate  325 mg Oral Q breakfast  . gabapentin  300 mg Oral QHS  . metoprolol tartrate  25 mg Oral BID  . mirabegron ER  50 mg Oral Daily  . pantoprazole  40 mg Oral Daily  . predniSONE  5 mg Oral Q breakfast  . rosuvastatin  20 mg Oral QPM  . cyanocobalamin  100 mcg Oral Daily   Continuous Infusions: . doxycycline (VIBRAMYCIN) IV 100 mg (12/03/17 1210)     LOS: 0 days   Geradine Girt  [facility]   *Please refer to [email or web address], password [email or web address] to get updated schedule on who will round on this patient, as hospitalists switch teams weekly. If 7PM-7AM, please contact night-coverage at [email or web address], password [email or web address] for any overnight needs.  12/03/2017, 2:09 PM

## 2017-12-03 NOTE — Progress Notes (Signed)
Pt resting in bed, speaking with family member. Has reddened area to front of left knee - area is marked and redness is well within the marked area. Patient reports continued pain to area but reports it is not as bad as yesterday and declines pain medication at this time. The patient was reluctant to take subQ Lovenox as she reports she is to have surgery on the left side of her face next week and was instructed to stop taking her ASA for a month prior to the surgery. Pt had a lower BP reading at ~4am (102/57) but is currently 125/68 with pulse of 79. Will monitor carefully.

## 2017-12-03 NOTE — Consult Note (Signed)
ORTHOPAEDIC CONSULTATION  REQUESTING PHYSICIAN: Geradine Girt, DO  Chief Complaint: Left knee pain  HPI: Anna Carlson is a 57 y.o. female who complains of atraumatic anterior left knee pain for the past 3 weeks.  She initially presented for outpatient care and was placed on Keflex which did not improve redness/swelling or pain.  Course of Keflex was extended by 2 weeks and symptoms persisted.  She presented to the hospital for evaluation.  She denies fever or systemic symptoms.  She denies trauma, she does report history of several falls recently and chronic neuropathy-she is being evaluated by neurology for same and reportedly has EEG testing scheduled approximately 1 week.    X-ray of the left knee showed no acute findings.  Tricompartmental chronic changes.  No appreciable joint effusion.  Soft tissues unremarkable.   Past Medical History:  Diagnosis Date  . Angina   . Arthritis   . Bilateral leg pain   . Hypertension   . PVD (peripheral vascular disease) ([place]) 11/28/2013   Past Surgical History:  Procedure Laterality Date  . ARTHROSCOPY KNEE W/ DRILLING Right 2008  . foot surgery Bilateral 2008,2009   Social History   Socioeconomic History  . Marital status: Single    Spouse name: Not on file  . Number of children: 8  . Years of education: 62  . Highest education level: Not on file  Occupational History    Comment: home maker  Social Needs  . Financial resource strain: Not on file  . Food insecurity:    Worry: Not on file    Inability: Not on file  . Transportation needs:    Medical: Not on file    Non-medical: Not on file  Tobacco Use  . Smoking status: Former Research scientist (life sciences)  . Smokeless tobacco: Never Used  . Tobacco comment: smoked as teen, quit years ago  Substance and Sexual Activity  . Alcohol use: No    Alcohol/week: 0.0 oz    Comment: socially  . Drug use: No  . Sexual activity: Not Currently  Lifestyle  . Physical activity:    Days per week:  Not on file    Minutes per session: Not on file  . Stress: Not on file  Relationships  . Social connections:    Talks on phone: Not on file    Gets together: Not on file    Attends religious service: Not on file    Active member of club or organization: Not on file    Attends meetings of clubs or organizations: Not on file    Relationship status: Not on file  Other Topics Concern  . Not on file  Social History Narrative   Lives with daughter   Caffeine use- coffee, tea, soda: 2-3 daily   Family History  Problem Relation Age of Onset  . Diabetes Mother   . Hypertension Mother   . Arthritis Mother   . Cataracts Mother   . Hyperlipidemia Father   . Diabetes Father   . Heart attack Father   . Diabetes type I Daughter    Allergies  Allergen Reactions  . Sulfa Antibiotics Swelling  . Leflunomide Rash    Mouth ulcers  Rash at injection site   Prior to Admission medications   Medication Sig Start Date End Date Taking? Authorizing Provider  Abatacept (ORENCIA) 125 MG/ML SOSY Inject 1 Syringe into the skin once a week. 11/16/17  Yes [provider]  albuterol (PROAIR HFA) 108 (90 BASE) MCG/ACT  inhaler Inhale 1-2 puffs into the lungs every 6 (six) hours as needed for wheezing or shortness of breath.   Yes [provider]  aspirin EC 81 MG tablet Take 1 tablet (81 mg total) by mouth daily. 10/05/15  Yes Penumalli, Earlean Polka, MD  b complex vitamins tablet Take 1 tablet by mouth daily.   Yes [provider]  cephALEXin (KEFLEX) 500 MG capsule Take 500 mg by mouth 4 (four) times daily. For 14 days 11/21/17 12/05/17 Yes [provider]  cholecalciferol (VITAMIN D) 1000 UNITS tablet Take 1,000 Units by mouth daily. 05/05/14  Yes [provider]  cyanocobalamin 100 MCG tablet Take 100 mcg by mouth daily.   Yes [provider]  dicyclomine (BENTYL) 20 MG tablet Take 20 mg by mouth every 6 (six) hours.   Yes [provider]  ferrous  sulfate 325 (65 FE) MG tablet Take 325 mg by mouth daily with breakfast.   Yes [provider]  furosemide (LASIX) 20 MG tablet Take 20 mg by mouth daily.   Yes [provider]  gabapentin (NEURONTIN) 300 MG capsule Take 300 mg by mouth at bedtime.    Yes [provider]  ibuprofen (ADVIL,MOTRIN) 800 MG tablet TAKE 1 TABLET BY MOUTH EVERY 8 HOURS AS NEEDED Patient taking differently: TAKE 1 TABLET BY MOUTH EVERY 8 HOURS AS NEEDED FOR PAIN 09/17/15  Yes Shelly Bombard, MD  metoprolol tartrate (LOPRESSOR) 25 MG tablet Take 25 mg by mouth 2 (two) times daily.  09/22/15  Yes [provider]  MYRBETRIQ 50 MG TB24 tablet Take 50 mg by mouth daily. 11/07/17  Yes [provider]  omeprazole (PRILOSEC) 40 MG capsule Take 40 mg by mouth daily as needed (heartburn).  05/27/14  Yes [provider]  predniSONE (DELTASONE) 5 MG tablet Take 5 mg by mouth daily.  05/27/14  Yes [provider]  rosuvastatin (CRESTOR) 20 MG tablet Take 20 mg by mouth every evening. 10/11/17  Yes [provider]  TOVIAZ 4 MG TB24 tablet Take 4 mg by mouth daily. 11/16/17  Yes [provider]   Dg Knee Complete 4 Views Left  Result Date: 12/02/2017 CLINICAL DATA:  Pain for couple of weeks, no known injury. Redness and swelling. EXAM: LEFT KNEE - COMPLETE 4+ VIEW COMPARISON:  Plain film of the LEFT knee dated 05/11/2008. FINDINGS: Mild tricompartmental degenerative joint space narrowing, similar to previous plain film of 05/11/2008. No acute or suspicious osseous lesion. No fracture line or displaced fracture fragment. No appreciable joint effusion and adjacent soft tissues are unremarkable. IMPRESSION: 1. No acute findings. 2. Mild tricompartmental degenerative joint space narrowing, similar to previous study of 05/11/2008. No large osteophytes or other secondary signs of advanced DJD. Electronically Signed   By: Franki Cabot M.D.   On: 12/02/2017 19:47     Positive ROS: All other systems have been reviewed and were otherwise negative with the exception of those mentioned in the HPI and as above.  Objective: Labs cbc Recent Labs    12/02/17 1930 12/03/17 0527  WBC 9.1 7.9  HGB 13.5 12.6  HCT 42.7 40.1  PLT 231 221    Labs inflam Recent Labs    12/02/17 2321  CRP 1.0*    Labs coag No results for input(s): INR, PTT in the last 72 hours.  Invalid input(s): PT  Recent Labs    12/02/17 1930 12/03/17 0527  NA 142 143  K 3.5 3.9  CL 106 111  CO2 26 26  GLUCOSE 101* 126*  BUN 11 10  CREATININE 0.71 0.65  CALCIUM 8.8* 8.5*    Physical Exam: Vitals:   12/03/17 0954 12/03/17 1336  BP: 125/68 115/65  Pulse: 79 69  Resp:  18  Temp:  98.1 F (36.7 C)  SpO2:  100%   General: Alert, no acute distress.  Upright in bed on arrival.  Son and daughter at bedside.  Calm, conversant. Mental status: Alert and Oriented x3 Neurologic: Speech Clear and organized, no gross focal findings or movement disorder appreciated. Respiratory: No cyanosis, no use of accessory musculature Cardiovascular: No pedal edema GI: Abdomen is soft and non-tender, non-distended. Skin: Warm and dry.   Extremities: Warm and well perfused w/o edema Psychiatric: Patient is competent for consent with normal mood and affect  MUSCULOSKELETAL:  LLE: Left knee prepatellar area with an approximately 8 x 6 cm area with improving blanching erythema.  No underlying fluid collection or fluctuance.  No overlying lesion.  The affected area appears significantly improved when compared to previously marked outline of erythema.  Left knee has range of motion 0 to about 115 degrees without significant discomfort.  Stable to varus and valgus stresses.  She has mild, reportedly stable (due to chronic arthritis) medial and lateral joint line soreness.  There is no visible or palpable effusion.  The prepatellar area noted above is also mildly tender.  Other extremities  are atraumatic with painless ROM and NVI.  Assessment / Plan: Principal Problem:   Cellulitis of left leg Active Problems:   ANEMIA, IRON DEFICIENCY   Rheumatoid arthritis (HCC)   Essential hypertension   HLD (hyperlipidemia)   Left knee pain - Pre-Patellar Bursitis  No pre-patellar fluid collection and No effusion.  No sign of knee joint infection.  Prepatellar and/or intra-articular aspiration/injection not indicated at this time.  Improving significantly with current regimen.   Recommend continuing current antibiotic regimen without surgery at this point.  The patient would like to avoid surgery if possible.  Weightbearing as tolerated  Avoid anterior knee trauma  If this problem worsens or persists while inpatient, please call and we will reevaluate the need for possible prepatellar I&D/bursectomy.  The possibility of this and details of same were discussed with the patient and her children.  She verbalized understanding.   Follow up in the office with Dr. Alain Marion in 1-2 weeks.  Please call with questions.   Prudencio Burly III PA-C 12/03/2017 5:05 PM

## 2017-12-04 DIAGNOSIS — I10 Essential (primary) hypertension: Secondary | ICD-10-CM

## 2017-12-04 NOTE — Progress Notes (Signed)
Writer concurs with students nurse's assessment and was present during all medication passes this morning and at noon. Patient alert and oriented x4. RN Barnetta Chapel) was made aware of redness noted at insertion site of IV on the right forearm and was in to assess prior to IV antibiotic given. Patient denied any pain with IV. Patient is currently lying in bed, watching TV with no complaints.

## 2017-12-04 NOTE — Progress Notes (Signed)
Progress Note    Anna Carlson  UJW:[id number] DOB: 11-Jan-1961  DOA: 12/02/2017 PCP: Benito Mccreedy, MD    Brief Narrative:    Medical records reviewed and are as summarized below:  Anna Carlson is an 57 y.o. female with medical history significant of RA (on prednisone and Orencia injection), hypertension, hyperlipidemia, GERD, PVD, iron deficiency anemia, who presents with left lower knee and leg pain.  Pt states that she began having redness, pain and swelling on L leg close to knee joint 2 weeks ago. She was seen at [facility] and started on course of keflex for 7 days. She had no improvement with antibiotics and returned to urgent care where they extended the course of keflex to 14 days. Still no significant improvement.  In the past several days, the redness and swelling have extended down to her lower leg, close to her ankle.  The pain is constant, 7 out of 10 in severity, sharp, nonradiating.     Assessment/Plan:   Principal Problem:   Cellulitis of left leg Active Problems:   ANEMIA, IRON DEFICIENCY   Rheumatoid arthritis (HCC)   Essential hypertension   HLD (hyperlipidemia)  Cellulitis of left leg/bursitis?:  -failed outpatient oral antibiotic treatment x 14 days of keflex -- changed to doxy -X-ray did not show joint effusion but does have severe arthritis - Blood cultures NGTD -ortho consult appreciated-- will follow up  ANEMIA, IRON DEFICIENCY:  -Continue iron supplement  Rheumatoid arthritis ([place]): on weekly Orencia injection and prednisone 5 mg daily -hold off Orencia  -Continue prednisone -continue Ibuprofen  Essential hypertension: -Continue metoprolol -IV hydralazine as needed -resume lasix in AM  HLD (hyperlipidemia): -Crestor     Family Communication/Anticipated D/C date and plan/Code Status   DVT prophylaxis: scd/ambulation Code Status: Full Code.  Family Communication: daughter at bedside 6/17 Disposition Plan:    Medical  Consultants:    ortho    Subjective:   Redness lessened to knee cap area-- no spreading  Objective:    Vitals:   12/03/17 2212 12/04/17 0545 12/04/17 0748 12/04/17 0947  BP: 130/72 (!) 129/58 128/70 131/76  Pulse: 60 (!) 58 63 74  Resp: 18 18 16    Temp: 98 F (36.7 C) 98 F (36.7 C) 98.6 F (37 C)   TempSrc: Oral Oral Oral   SpO2: 97% 100% 100%   Weight:      Height:        Intake/Output Summary (Last 24 hours) at 12/04/2017 1241 Last data filed at 12/04/2017 1200 Gross per 24 hour  Intake 564 ml  Output 450 ml  Net 114 ml   Filed Weights   12/02/17 2003 12/02/17 2251  Weight: 72.6 kg (160 lb) 73.8 kg (162 lb 11.2 oz)    Exam: In bed, NAD rrr Left knee with swelling and redness, no streaking Mood and affect normal  Data Reviewed:   I have personally reviewed following labs and imaging studies:  Labs: Labs show the following:   Basic Metabolic Panel: Recent Labs  Lab 12/02/17 1930 12/03/17 0527  NA 142 143  K 3.5 3.9  CL 106 111  CO2 26 26  GLUCOSE 101* 126*  BUN 11 10  CREATININE 0.71 0.65  CALCIUM 8.8* 8.5*   GFR Estimated Creatinine Clearance: 77.4 mL/min (by C-G formula based on SCr of 0.65 mg/dL). Liver Function Tests: Recent Labs  Lab 12/02/17 1930  AST 26  ALT 28  ALKPHOS 60  BILITOT 0.4  PROT 7.0  ALBUMIN  3.8   No results for input(s): LIPASE, AMYLASE in the last 168 hours. No results for input(s): AMMONIA in the last 168 hours. Coagulation profile No results for input(s): INR, PROTIME in the last 168 hours.  CBC: Recent Labs  Lab 12/02/17 1930 12/03/17 0527  WBC 9.1 7.9  NEUTROABS 6.4  --   HGB 13.5 12.6  HCT 42.7 40.1  MCV 89.3 90.5  PLT 231 221   Cardiac Enzymes: No results for input(s): CKTOTAL, CKMB, CKMBINDEX, TROPONINI in the last 168 hours. BNP (last 3 results) No results for input(s): PROBNP in the last 8760 hours. CBG: No results for input(s): GLUCAP in the last 168 hours. D-Dimer: No results for  input(s): DDIMER in the last 72 hours. Hgb A1c: No results for input(s): HGBA1C in the last 72 hours. Lipid Profile: No results for input(s): CHOL, HDL, LDLCALC, TRIG, CHOLHDL, LDLDIRECT in the last 72 hours. Thyroid function studies: No results for input(s): TSH, T4TOTAL, T3FREE, THYROIDAB in the last 72 hours.  Invalid input(s): FREET3 Anemia work up: No results for input(s): VITAMINB12, FOLATE, FERRITIN, TIBC, IRON, RETICCTPCT in the last 72 hours. Sepsis Labs: Recent Labs  Lab 12/02/17 1930 12/02/17 1938 12/03/17 0527  WBC 9.1  --  7.9  LATICACIDVEN  --  0.92  --     Microbiology Recent Results (from the past 240 hour(s))  Culture, blood (routine x 2)     Status: None (Preliminary result)   Collection Time: 12/02/17  8:30 PM  Result Value Ref Range Status   Specimen Description BLOOD RIGHT FOREARM  Final   Special Requests   Final    BOTTLES DRAWN AEROBIC AND ANAEROBIC Blood Culture adequate volume   Culture   Final    NO GROWTH < 24 HOURS Performed at [facility], [address], [place], [place] [address]    Report Status PENDING  Incomplete  Culture, blood (routine x 2)     Status: None (Preliminary result)   Collection Time: 12/02/17  8:46 PM  Result Value Ref Range Status   Specimen Description BLOOD LEFT HAND  Final   Special Requests   Final    BOTTLES DRAWN AEROBIC AND ANAEROBIC Blood Culture adequate volume   Culture   Final    NO GROWTH < 24 HOURS Performed at [facility], [place] [address], [place], [place] [address]    Report Status PENDING  Incomplete    Procedures and diagnostic studies:  Dg Knee Complete 4 Views Left  Result Date: 12/02/2017 CLINICAL DATA:  Pain for couple of weeks, no known injury. Redness and swelling. EXAM: LEFT KNEE - COMPLETE 4+ VIEW COMPARISON:  Plain film of the LEFT knee dated 05/11/2008. FINDINGS: Mild tricompartmental degenerative joint space narrowing, similar to previous plain film of 05/11/2008. No acute or  suspicious osseous lesion. No fracture line or displaced fracture fragment. No appreciable joint effusion and adjacent soft tissues are unremarkable. IMPRESSION: 1. No acute findings. 2. Mild tricompartmental degenerative joint space narrowing, similar to previous study of 05/11/2008. No large osteophytes or other secondary signs of advanced DJD. Electronically Signed   By: Franki Cabot M.D.   On: 12/02/2017 19:47    Medications:   . B-complex with vitamin C  1 tablet Oral Daily  . cholecalciferol  1,000 Units Oral Daily  . dicyclomine  20 mg Oral Q6H  . ferrous sulfate  325 mg Oral Q breakfast  . gabapentin  300 mg Oral QHS  . metoprolol tartrate  25  mg Oral BID  . mirabegron ER  50 mg Oral Daily  . pantoprazole  40 mg Oral Daily  . predniSONE  5 mg Oral Q breakfast  . rosuvastatin  20 mg Oral QPM  . cyanocobalamin  100 mcg Oral Daily   Continuous Infusions: . doxycycline (VIBRAMYCIN) IV 100 mg (12/04/17 1039)     LOS: 1 day   Geradine Girt  [facility]   *Please refer to [email or web address], password [email or web address] to get updated schedule on who will round on this patient, as hospitalists switch teams weekly. If 7PM-7AM, please contact night-coverage at [email or web address], password [email or web address] for any overnight needs.  12/04/2017, 12:41 PM

## 2017-12-05 LAB — BASIC METABOLIC PANEL
ANION GAP: 6 (ref 5–15)
BUN: 11 mg/dL (ref 6–20)
CO2: 29 mmol/L (ref 22–32)
Calcium: 9.3 mg/dL (ref 8.9–10.3)
Chloride: 109 mmol/L (ref 101–111)
Creatinine, Ser: 0.63 mg/dL (ref 0.44–1.00)
GFR calc Af Amer: >60 mL/min (ref >60)
GLUCOSE: 83 mg/dL (ref 65–99)
POTASSIUM: 3.7 mmol/L (ref 3.5–5.1)
SODIUM: 144 mmol/L (ref 135–145)

## 2017-12-05 LAB — CBC
HCT: 45.2 % (ref 36.0–46.0)
HEMOGLOBIN: 14.1 g/dL (ref 12.0–15.0)
MCH: 28.5 pg (ref 26.0–34.0)
MCHC: 31.2 g/dL (ref 30.0–36.0)
MCV: 91.5 fL (ref 78.0–100.0)
PLATELETS: 220 10*3/uL (ref 150–400)
RBC: 4.94 MIL/uL (ref 3.87–5.11)
RDW: 15 % (ref 11.5–15.5)
WBC: 8.5 10*3/uL (ref 4.0–10.5)

## 2017-12-05 MED ORDER — ACETAMINOPHEN 325 MG PO TABS: 650.0000 mg | ORAL_TABLET | Freq: Four times a day (QID) | ORAL | Status: AC | PRN

## 2017-12-05 MED ORDER — DOXYCYCLINE HYCLATE 100 MG PO TABS
100.0000 mg | ORAL_TABLET | Freq: Two times a day (BID) | ORAL | Status: DC
  Administered 2017-12-05: 100 mg via ORAL
  Filled 2017-12-05: qty 1

## 2017-12-05 MED ORDER — DOXYCYCLINE HYCLATE 100 MG PO TABS: 100.0000 mg | ORAL_TABLET | Freq: Two times a day (BID) | ORAL | 0 refills | Status: DC

## 2017-12-05 MED ORDER — ASPIRIN EC 81 MG PO TBEC: 81.0000 mg | DELAYED_RELEASE_TABLET | Freq: Every day | ORAL | Status: AC

## 2017-12-05 NOTE — Discharge Summary (Signed)
Physician Discharge Summary  Anna Carlson:[id number] DOB: [birth year] DOA: 12/02/2017  PCP: Benito Mccreedy, MD  Admit date: 12/02/2017 Discharge date: 12/05/2017  Admitted From: home Discharge disposition: home   Recommendations for Outpatient Follow-Up:   1. Ortho appointment made for Monday for knee follow up to see if resolved vs wash out being needed   Discharge Diagnosis:   Principal Problem:   Cellulitis of left leg Active Problems:   ANEMIA, IRON DEFICIENCY   Rheumatoid arthritis ([place])   Essential hypertension   HLD (hyperlipidemia)    Discharge Condition: Improved.  Diet recommendation: regular  Wound care: None.  Code status: Full.   History of Present Illness:   Anna Carlson is a 57 y.o. female with medical history significant of RA (on prednisone and Orencia injection), hypertension, hyperlipidemia, GERD, PVD, iron deficiency anemia, who presents with left lower knee and leg pain.  Pt states that she began having redness, pain and swelling on L leg close to knee joint 2 weeks ago. She was seen at [facility] and started on course of keflex for 7 days. She had no improvement with antibiotics and returned to urgent care where they extended the course of keflex to 14 days. Still no significant improvement.  In the past several days, the redness and swelling have extended down to her lower leg, close to her ankle.  The pain is constant, 7 out of 10 in severity, sharp, nonradiating.  Patient does not have fever or chills. She states that she has nausea, no vomiting or abdominal pain.  She states she has intermittent mild diarrhea recently.  No symptoms of UTI or unilateral weakness.     Hospital Course by Problem:   Cellulitis of left leg/pre-patellar bursitis: The erythema and swelling are not involving calf area, less likely to have DVT.  Patient failed outpatient oral antibiotic treatment.   -IV abx-- doxy as she had 2 weeks of keflex w/o  resolution -doxy PO to finish treatment  ANEMIA, IRON DEFICIENCY:  -Continue iron supplement  Rheumatoid arthritis ([place]): on weekly Orencia injection and prednisone 5 mg daily -hold off Orencia  -Continue prednisone -continue Ibuprofen  Essential hypertension: -Continue metoprolol -resume lasix  HLD (hyperlipidemia): -Crestor      Medical Consultants:   ortho   Discharge Exam:   Vitals:   12/04/17 1300 12/05/17 0511  BP: 128/68 (!) 134/91  Pulse: 63 (!) 57  Resp: 15 18  Temp: 98 F (36.7 C) 97.6 F (36.4 C)  SpO2: 100% 100%   Vitals:   12/04/17 0748 12/04/17 0947 12/04/17 1300 12/05/17 0511  BP: 128/70 131/76 128/68 (!) 134/91  Pulse: 63 74 63 (!) 57  Resp: 16  15 18   Temp: 98.6 F (37 C)  98 F (36.7 C) 97.6 F (36.4 C)  TempSrc: Oral  Oral Oral  SpO2: 100%  100% 100%  Weight:      Height:        General exam: Appears calm and comfortable.  Improving redness of right knee-- mainly now over patella    The results of significant diagnostics from this hospitalization (including imaging, microbiology, ancillary and laboratory) are listed below for reference.     Procedures and Diagnostic Studies:   Dg Knee Complete 4 Views Left  Result Date: 12/02/2017 CLINICAL DATA:  Pain for couple of weeks, no known injury. Redness and swelling. EXAM: LEFT KNEE - COMPLETE 4+ VIEW COMPARISON:  Plain film of the LEFT knee dated 05/11/2008. FINDINGS: Mild  tricompartmental degenerative joint space narrowing, similar to previous plain film of 05/11/2008. No acute or suspicious osseous lesion. No fracture line or displaced fracture fragment. No appreciable joint effusion and adjacent soft tissues are unremarkable. IMPRESSION: 1. No acute findings. 2. Mild tricompartmental degenerative joint space narrowing, similar to previous study of 05/11/2008. No large osteophytes or other secondary signs of advanced DJD. Electronically Signed   By: Franki Cabot M.D.   On:  12/02/2017 19:47     Labs:   Basic Metabolic Panel: Recent Labs  Lab 12/02/17 1930 12/03/17 0527 12/05/17 0640  NA 142 143 144  K 3.5 3.9 3.7  CL 106 111 109  CO2 26 26 29   GLUCOSE 101* 126* 83  BUN 11 10 11   CREATININE 0.71 0.65 0.63  CALCIUM 8.8* 8.5* 9.3   GFR Estimated Creatinine Clearance: 77.4 mL/min (by C-G formula based on SCr of 0.63 mg/dL). Liver Function Tests: Recent Labs  Lab 12/02/17 1930  AST 26  ALT 28  ALKPHOS 60  BILITOT 0.4  PROT 7.0  ALBUMIN 3.8   No results for input(s): LIPASE, AMYLASE in the last 168 hours. No results for input(s): AMMONIA in the last 168 hours. Coagulation profile No results for input(s): INR, PROTIME in the last 168 hours.  CBC: Recent Labs  Lab 12/02/17 1930 12/03/17 0527 12/05/17 0640  WBC 9.1 7.9 8.5  NEUTROABS 6.4  --   --   HGB 13.5 12.6 14.1  HCT 42.7 40.1 45.2  MCV 89.3 90.5 91.5  PLT 231 221 220   Cardiac Enzymes: No results for input(s): CKTOTAL, CKMB, CKMBINDEX, TROPONINI in the last 168 hours. BNP: Invalid input(s): POCBNP CBG: No results for input(s): GLUCAP in the last 168 hours. D-Dimer No results for input(s): DDIMER in the last 72 hours. Hgb A1c No results for input(s): HGBA1C in the last 72 hours. Lipid Profile No results for input(s): CHOL, HDL, LDLCALC, TRIG, CHOLHDL, LDLDIRECT in the last 72 hours. Thyroid function studies No results for input(s): TSH, T4TOTAL, T3FREE, THYROIDAB in the last 72 hours.  Invalid input(s): FREET3 Anemia work up No results for input(s): VITAMINB12, FOLATE, FERRITIN, TIBC, IRON, RETICCTPCT in the last 72 hours. Microbiology Recent Results (from the past 240 hour(s))  Culture, blood (routine x 2)     Status: None (Preliminary result)   Collection Time: 12/02/17  8:30 PM  Result Value Ref Range Status   Specimen Description BLOOD RIGHT FOREARM  Final   Special Requests   Final    BOTTLES DRAWN AEROBIC AND ANAEROBIC Blood Culture adequate volume    Culture   Final    NO GROWTH 2 DAYS Performed at [facility], [address] [place], [place] [address]    Report Status PENDING  Incomplete  Culture, blood (routine x 2)     Status: None (Preliminary result)   Collection Time: 12/02/17  8:46 PM  Result Value Ref Range Status   Specimen Description BLOOD LEFT HAND  Final   Special Requests   Final    BOTTLES DRAWN AEROBIC AND ANAEROBIC Blood Culture adequate volume   Culture   Final    NO GROWTH 2 DAYS Performed at [facility], [place] [address], [place], [place] [address]    Report Status PENDING  Incomplete     Discharge Instructions:   Discharge Instructions    Diet - low sodium heart healthy   Complete by:  As directed    Increase activity slowly   Complete by:  As directed      Allergies as of 12/05/2017      Reactions   Sulfa Antibiotics Swelling   Leflunomide Rash   Mouth ulcers  Rash at injection site      Medication List    STOP taking these medications   amoxicillin 500 MG capsule Commonly known as:  AMOXIL   cephALEXin 500 MG capsule Commonly known as:  KEFLEX   ORENCIA 125 MG/ML Sosy Generic drug:  Abatacept   TOVIAZ 4 MG Tb24 tablet Generic drug:  fesoterodine     TAKE these medications   acetaminophen 325 MG tablet Commonly known as:  TYLENOL Take 2 tablets (650 mg total) by mouth every 6 (six) hours as needed for fever or headache.   aspirin EC 81 MG tablet Take 1 tablet (81 mg total) by mouth daily.   b complex vitamins tablet Take 1 tablet by mouth daily.   cholecalciferol 1000 units tablet Commonly known as:  VITAMIN D Take 1,000 Units by mouth daily.   cyanocobalamin 100 MCG tablet Take 100 mcg by mouth daily.   dicyclomine 20 MG tablet Commonly known as:  BENTYL Take 20 mg by mouth every 6 (six) hours.   doxycycline 100 MG tablet Commonly known as:  VIBRA-TABS Take 1 tablet (100 mg total) by mouth every 12 (twelve) hours.   ferrous sulfate 325 (65 FE) MG  tablet Take 325 mg by mouth daily with breakfast.   furosemide 20 MG tablet Commonly known as:  LASIX Take 20 mg by mouth daily.   gabapentin 300 MG capsule Commonly known as:  NEURONTIN Take 300 mg by mouth at bedtime.   ibuprofen 800 MG tablet Commonly known as:  ADVIL,MOTRIN TAKE 1 TABLET BY MOUTH EVERY 8 HOURS AS NEEDED What changed:    how much to take  how to take this  when to take this   metoprolol tartrate 25 MG tablet Commonly known as:  LOPRESSOR Take 25 mg by mouth 2 (two) times daily.   MYRBETRIQ 50 MG Tb24 tablet Generic drug:  mirabegron ER Take 50 mg by mouth daily.   omeprazole 40 MG capsule Commonly known as:  PRILOSEC Take 40 mg by mouth daily as needed (heartburn).   predniSONE 5 MG tablet Commonly known as:  DELTASONE Take 5 mg by mouth daily.   PROAIR HFA 108 (90 Base) MCG/ACT inhaler Generic drug:  albuterol Inhale 1-2 puffs into the lungs every 6 (six) hours as needed for wheezing or shortness of breath.   rosuvastatin 20 MG tablet Commonly known as:  CRESTOR Take 20 mg by mouth every evening.      Follow-up Information    Osei-Bonsu, Iona Beard, MD Follow up in 1 week(s).   Specialty:  Internal Medicine Contact information: [address] [place] [address] [phone]        Renette Butters, MD Follow up.   Specialty:  Orthopedic Surgery Why:  monday, june 24th at 10 am Contact information: [place] [place] [address] [phone]            Time coordinating discharge: 35 min  Signed:  Geradine Girt  [facility] 12/05/2017, 8:57 AM

## 2017-12-05 NOTE — Progress Notes (Signed)
[name] to be D/C'd Home per MD order.  Discussed with the patient and all questions fully answered.  VSS, Skin clean, dry and intact without evidence of skin break down, no evidence of skin tears noted. IV catheter discontinued intact. Site without signs and symptoms of complications. Dressing and pressure applied.  An After Visit Summary was printed and given to the patient. Scripts sent to pharmacy.   D/c education completed with patient/family including follow up instructions, medication list, d/c activities limitations if indicated, with other d/c instructions as indicated by MD - patient able to verbalize understanding, all questions fully answered.   Patient instructed to return to ED, call 911, or call MD for any changes in condition.   Patient escorted via [place], and D/C home via private auto.  Betha Loa Teri Legacy 12/05/2017 1:17 PM

## 2017-12-06 ENCOUNTER — Other Ambulatory Visit: Payer: Self-pay

## 2017-12-06 DIAGNOSIS — M069 Rheumatoid arthritis, unspecified: Secondary | ICD-10-CM | POA: Diagnosis present

## 2017-12-06 DIAGNOSIS — Z79899 Other long term (current) drug therapy: Secondary | ICD-10-CM

## 2017-12-06 DIAGNOSIS — Z87891 Personal history of nicotine dependence: Secondary | ICD-10-CM

## 2017-12-06 DIAGNOSIS — E876 Hypokalemia: Secondary | ICD-10-CM | POA: Diagnosis present

## 2017-12-06 DIAGNOSIS — Z7982 Long term (current) use of aspirin: Secondary | ICD-10-CM

## 2017-12-06 DIAGNOSIS — L52 Erythema nodosum: Secondary | ICD-10-CM | POA: Diagnosis present

## 2017-12-06 DIAGNOSIS — Z7952 Long term (current) use of systemic steroids: Secondary | ICD-10-CM

## 2017-12-06 DIAGNOSIS — M1990 Unspecified osteoarthritis, unspecified site: Secondary | ICD-10-CM | POA: Diagnosis present

## 2017-12-06 DIAGNOSIS — I10 Essential (primary) hypertension: Secondary | ICD-10-CM | POA: Diagnosis present

## 2017-12-06 DIAGNOSIS — Z888 Allergy status to other drugs, medicaments and biological substances status: Secondary | ICD-10-CM

## 2017-12-06 DIAGNOSIS — I739 Peripheral vascular disease, unspecified: Secondary | ICD-10-CM | POA: Diagnosis present

## 2017-12-06 DIAGNOSIS — Z882 Allergy status to sulfonamides status: Secondary | ICD-10-CM

## 2017-12-06 DIAGNOSIS — L03116 Cellulitis of left lower limb: Principal | ICD-10-CM | POA: Diagnosis present

## 2017-12-07 ENCOUNTER — Other Ambulatory Visit: Payer: Self-pay

## 2017-12-07 ENCOUNTER — Inpatient Hospital Stay (HOSPITAL_COMMUNITY)
Admission: EM | Admit: 2017-12-07 | Discharge: 2017-12-12 | DRG: 603 | Disposition: A | Discharge status: Home / Self Care | Payer: Medicaid Other | Attending: Internal Medicine | Admitting: Internal Medicine

## 2017-12-07 ENCOUNTER — Encounter (HOSPITAL_COMMUNITY): Payer: Self-pay

## 2017-12-07 DIAGNOSIS — L03116 Cellulitis of left lower limb: Secondary | ICD-10-CM | POA: Diagnosis not present

## 2017-12-07 DIAGNOSIS — E876 Hypokalemia: Secondary | ICD-10-CM | POA: Diagnosis present

## 2017-12-07 DIAGNOSIS — I10 Essential (primary) hypertension: Secondary | ICD-10-CM | POA: Diagnosis present

## 2017-12-07 DIAGNOSIS — M069 Rheumatoid arthritis, unspecified: Secondary | ICD-10-CM | POA: Diagnosis present

## 2017-12-07 LAB — CULTURE, BLOOD (ROUTINE X 2)
Culture: NO GROWTH
Culture: NO GROWTH
SPECIAL REQUESTS: ADEQUATE
Special Requests: ADEQUATE

## 2017-12-07 LAB — CBC WITH DIFFERENTIAL/PLATELET
ABS IMMATURE GRANULOCYTES: 0 10*3/uL (ref 0.0–0.1)
Basophils Absolute: 0 10*3/uL (ref 0.0–0.1)
Basophils Relative: 1 %
EOS PCT: 2 %
Eosinophils Absolute: 0.2 10*3/uL (ref 0.0–0.7)
HEMATOCRIT: 42 % (ref 36.0–46.0)
HEMOGLOBIN: 13.1 g/dL (ref 12.0–15.0)
Immature Granulocytes: 0 %
LYMPHS ABS: 2.1 10*3/uL (ref 0.7–4.0)
LYMPHS PCT: 25 %
MCH: 27.9 pg (ref 26.0–34.0)
MCHC: 31.2 g/dL (ref 30.0–36.0)
MCV: 89.4 fL (ref 78.0–100.0)
MONO ABS: 0.6 10*3/uL (ref 0.1–1.0)
Monocytes Relative: 7 %
NEUTROS ABS: 5.4 10*3/uL (ref 1.7–7.7)
Neutrophils Relative %: 65 %
Platelets: 218 10*3/uL (ref 150–400)
RBC: 4.7 MIL/uL (ref 3.87–5.11)
RDW: 14.7 % (ref 11.5–15.5)
WBC: 8.3 10*3/uL (ref 4.0–10.5)

## 2017-12-07 LAB — COMPREHENSIVE METABOLIC PANEL
ALT: 25 U/L (ref 14–54)
ANION GAP: 8 (ref 5–15)
AST: 22 U/L (ref 15–41)
Albumin: 3.8 g/dL (ref 3.5–5.0)
Alkaline Phosphatase: 60 U/L (ref 38–126)
BUN: 11 mg/dL (ref 6–20)
CHLORIDE: 109 mmol/L (ref 101–111)
CO2: 26 mmol/L (ref 22–32)
Calcium: 8.9 mg/dL (ref 8.9–10.3)
Creatinine, Ser: 0.65 mg/dL (ref 0.44–1.00)
GFR calc Af Amer: >60 mL/min (ref >60)
GFR calc non Af Amer: >60 mL/min (ref >60)
GLUCOSE: 102 mg/dL — AB (ref 65–99)
POTASSIUM: 3.4 mmol/L — AB (ref 3.5–5.1)
SODIUM: 143 mmol/L (ref 135–145)
Total Bilirubin: 0.6 mg/dL (ref 0.3–1.2)
Total Protein: 6.5 g/dL (ref 6.5–8.1)

## 2017-12-07 LAB — URINALYSIS, ROUTINE W REFLEX MICROSCOPIC
BACTERIA UA: NONE SEEN
Bilirubin Urine: NEGATIVE
GLUCOSE, UA: NEGATIVE mg/dL
HGB URINE DIPSTICK: NEGATIVE
KETONES UR: NEGATIVE mg/dL
NITRITE: NEGATIVE
PROTEIN: NEGATIVE mg/dL
Specific Gravity, Urine: 1.017 (ref 1.005–1.030)
pH: 5 (ref 5.0–8.0)

## 2017-12-07 LAB — I-STAT CG4 LACTIC ACID, ED
Lactic Acid, Venous: 0.74 mmol/L (ref 0.5–1.9)
Lactic Acid, Venous: 1.22 mmol/L (ref 0.5–1.9)

## 2017-12-07 LAB — I-STAT BETA HCG BLOOD, ED (MC, WL, AP ONLY)

## 2017-12-07 MED ORDER — SODIUM CHLORIDE 0.9% FLUSH
3.0000 mL | Freq: Two times a day (BID) | INTRAVENOUS | Status: DC
  Administered 2017-12-07 – 2017-12-12 (×9): 3 mL via INTRAVENOUS

## 2017-12-07 MED ORDER — ROSUVASTATIN CALCIUM 20 MG PO TABS
20.0000 mg | ORAL_TABLET | Freq: Every evening | ORAL | Status: DC
  Administered 2017-12-07 – 2017-12-11 (×5): 20 mg via ORAL
  Filled 2017-12-07 (×6): qty 1

## 2017-12-07 MED ORDER — IBUPROFEN 200 MG PO TABS: 800.0000 mg | ORAL_TABLET | Freq: Three times a day (TID) | ORAL | Status: DC | PRN

## 2017-12-07 MED ORDER — VITAMIN D 1000 UNITS PO TABS
1000.0000 [IU] | ORAL_TABLET | Freq: Every day | ORAL | Status: DC
  Administered 2017-12-07 – 2017-12-12 (×6): 1000 [IU] via ORAL
  Filled 2017-12-07 (×6): qty 1

## 2017-12-07 MED ORDER — VANCOMYCIN HCL IN DEXTROSE 1-5 GM/200ML-% IV SOLN
1000.0000 mg | Freq: Two times a day (BID) | INTRAVENOUS | Status: DC
  Administered 2017-12-07 – 2017-12-10 (×8): 1000 mg via INTRAVENOUS
  Filled 2017-12-07 (×8): qty 200

## 2017-12-07 MED ORDER — METOPROLOL TARTRATE 25 MG PO TABS
25.0000 mg | ORAL_TABLET | Freq: Two times a day (BID) | ORAL | Status: DC
  Administered 2017-12-07 – 2017-12-12 (×11): 25 mg via ORAL
  Filled 2017-12-07 (×11): qty 1

## 2017-12-07 MED ORDER — PREDNISONE 5 MG PO TABS
5.0000 mg | ORAL_TABLET | Freq: Every day | ORAL | Status: DC
  Administered 2017-12-07 – 2017-12-12 (×6): 5 mg via ORAL
  Filled 2017-12-07 (×6): qty 1

## 2017-12-07 MED ORDER — GABAPENTIN 300 MG PO CAPS
300.0000 mg | ORAL_CAPSULE | Freq: Every day | ORAL | Status: DC
  Administered 2017-12-07 – 2017-12-11 (×5): 300 mg via ORAL
  Filled 2017-12-07 (×5): qty 1

## 2017-12-07 MED ORDER — ALBUTEROL SULFATE (2.5 MG/3ML) 0.083% IN NEBU: 2.5000 mg | INHALATION_SOLUTION | Freq: Four times a day (QID) | RESPIRATORY_TRACT | Status: DC | PRN

## 2017-12-07 MED ORDER — VITAMIN B-12 100 MCG PO TABS
100.0000 ug | ORAL_TABLET | Freq: Every day | ORAL | Status: DC
  Administered 2017-12-07 – 2017-12-12 (×6): 100 ug via ORAL
  Filled 2017-12-07 (×6): qty 1

## 2017-12-07 MED ORDER — ACETAMINOPHEN 650 MG RE SUPP: 650.0000 mg | Freq: Four times a day (QID) | RECTAL | Status: DC | PRN

## 2017-12-07 MED ORDER — PANTOPRAZOLE SODIUM 40 MG PO TBEC
40.0000 mg | DELAYED_RELEASE_TABLET | Freq: Every day | ORAL | Status: DC
  Administered 2017-12-07 – 2017-12-12 (×6): 40 mg via ORAL
  Filled 2017-12-07 (×6): qty 1

## 2017-12-07 MED ORDER — SODIUM CHLORIDE 0.9% FLUSH: 3.0000 mL | INTRAVENOUS | Status: DC | PRN

## 2017-12-07 MED ORDER — B COMPLEX-C PO TABS
1.0000 | ORAL_TABLET | Freq: Every day | ORAL | Status: DC
  Administered 2017-12-07 – 2017-12-12 (×6): 1 via ORAL
  Filled 2017-12-07 (×6): qty 1

## 2017-12-07 MED ORDER — SODIUM CHLORIDE 0.9 % IV SOLN
Freq: Once | INTRAVENOUS | Status: AC
  Administered 2017-12-07: 04:00:00 via INTRAVENOUS

## 2017-12-07 MED ORDER — ACETAMINOPHEN 325 MG PO TABS: 650.0000 mg | ORAL_TABLET | Freq: Four times a day (QID) | ORAL | Status: DC | PRN

## 2017-12-07 MED ORDER — ONDANSETRON HCL 4 MG/2ML IJ SOLN: 4.0000 mg | Freq: Four times a day (QID) | INTRAMUSCULAR | Status: DC | PRN

## 2017-12-07 MED ORDER — DICYCLOMINE HCL 20 MG PO TABS
20.0000 mg | ORAL_TABLET | Freq: Three times a day (TID) | ORAL | Status: DC
  Administered 2017-12-07 – 2017-12-12 (×22): 20 mg via ORAL
  Filled 2017-12-07 (×22): qty 1

## 2017-12-07 MED ORDER — ONDANSETRON HCL 4 MG PO TABS: 4.0000 mg | ORAL_TABLET | Freq: Four times a day (QID) | ORAL | Status: DC | PRN

## 2017-12-07 MED ORDER — SODIUM CHLORIDE 0.9 % IV SOLN
250.0000 mL | INTRAVENOUS | Status: DC | PRN
  Administered 2017-12-07 – 2017-12-11 (×6): 250 mL via INTRAVENOUS

## 2017-12-07 MED ORDER — POTASSIUM CHLORIDE CRYS ER 20 MEQ PO TBCR
40.0000 meq | EXTENDED_RELEASE_TABLET | Freq: Once | ORAL | Status: AC
  Administered 2017-12-07: 40 meq via ORAL
  Filled 2017-12-07: qty 2

## 2017-12-07 MED ORDER — SENNOSIDES-DOCUSATE SODIUM 8.6-50 MG PO TABS
1.0000 | ORAL_TABLET | Freq: Every evening | ORAL | Status: DC | PRN
  Administered 2017-12-08: 1 via ORAL
  Filled 2017-12-07 (×2): qty 1

## 2017-12-07 NOTE — ED Provider Notes (Signed)
[facility] EMERGENCY DEPARTMENT Provider Note   CSN: [id number] Arrival date & time: 12/06/17  2355     History   Chief Complaint Chief Complaint  Patient presents with  . Cellulitis    HPI Anna Carlson is a 57 y.o. female.  HPI   57 year old female with history of rheumatoid arthritis here with recurrent left knee erythema.  The patient was recently hospitalized and discharged yesterday after hospitalization for cellulitis.  She had failed outpatient Keflex and was admitted.  She was initially placed on Rocephin then doxycycline and sent home.  Orthopedics was consulted and thought that it was not a septic arthritis at that time.  She states that approximately 24 hours after coming off the IV treatment, she developed worsening redness that is now worse than it was before she was previously admitted.  The patient has had aching, throbbing, burning left leg pain that is now extending down towards her ankle.  It is worse with movement and palpation.  She is had subjective fevers and chills.  No nausea or vomiting.  Denies any new trauma to the area.  Denies any lower extremity numbness or weakness.  Past Medical History:  Diagnosis Date  . Angina   . Arthritis   . Bilateral leg pain   . Hypertension   . PVD (peripheral vascular disease) ([place]) 11/28/2013    Patient Active Problem List   Diagnosis Date Noted  . Hypokalemia 12/07/2017  . Left leg cellulitis 12/07/2017  . Cellulitis of left leg 12/02/2017  . Essential hypertension 12/02/2017  . HLD (hyperlipidemia) 12/02/2017  . Rheumatoid arthritis ([place]) 08/29/2007  . FIBROIDS, UTERUS 02/14/2007  . HEMORRHOIDS, INTERNAL 01/13/2007    Past Surgical History:  Procedure Laterality Date  . ARTHROSCOPY KNEE W/ DRILLING Right 2008  . foot surgery Bilateral 2008,2009     OB History   None      Home Medications    Prior to Admission medications   Medication Sig Start Date End Date Taking?  Authorizing Provider  acetaminophen (TYLENOL) 325 MG tablet Take 2 tablets (650 mg total) by mouth every 6 (six) hours as needed for fever or headache. 12/05/17  Yes Vann, Jessica U, DO  albuterol (PROAIR HFA) 108 (90 BASE) MCG/ACT inhaler Inhale 1-2 puffs into the lungs every 6 (six) hours as needed for wheezing or shortness of breath.   Yes [provider]  b complex vitamins tablet Take 1 tablet by mouth daily.   Yes [provider]  cholecalciferol (VITAMIN D) 1000 UNITS tablet Take 1,000 Units by mouth daily. 05/05/14  Yes [provider]  cyanocobalamin 100 MCG tablet Take 100 mcg by mouth daily.   Yes [provider]  dicyclomine (BENTYL) 20 MG tablet Take 20 mg by mouth every 6 (six) hours.   Yes [provider]  ferrous sulfate 325 (65 FE) MG tablet Take 325 mg by mouth daily with breakfast.   Yes [provider]  furosemide (LASIX) 20 MG tablet Take 20 mg by mouth daily.   Yes [provider]  gabapentin (NEURONTIN) 300 MG capsule Take 300 mg by mouth at bedtime.    Yes [provider]  ibuprofen (ADVIL,MOTRIN) 800 MG tablet TAKE 1 TABLET BY MOUTH EVERY 8 HOURS AS NEEDED Patient taking differently: TAKE 1 TABLET BY MOUTH EVERY 8 HOURS AS NEEDED FOR PAIN 09/17/15  Yes Shelly Bombard, MD  metoprolol tartrate (LOPRESSOR) 25 MG tablet Take 25 mg by mouth 2 (two) times daily.  09/22/15  Yes [provider]  omeprazole (PRILOSEC) 40 MG capsule Take 40 mg by mouth daily as needed (heartburn).  05/27/14  Yes [provider]  predniSONE (DELTASONE) 5 MG tablet Take 5 mg by mouth daily.  05/27/14  Yes [provider]  rosuvastatin (CRESTOR) 20 MG tablet Take 20 mg by mouth every evening. 10/11/17  Yes [provider]  aspirin EC 81 MG tablet Take 1 tablet (81 mg total) by mouth daily. Patient not taking: Reported on 12/07/2017 12/05/17   Geradine Girt, DO  doxycycline (VIBRA-TABS) 100 MG tablet  Take 1 tablet (100 mg total) by mouth every 12 (twelve) hours. 12/05/17   Geradine Girt, DO    Family History Family History  Problem Relation Age of Onset  . Diabetes Mother   . Hypertension Mother   . Arthritis Mother   . Cataracts Mother   . Hyperlipidemia Father   . Diabetes Father   . Heart attack Father   . Diabetes type I Daughter     Social History Social History   Tobacco Use  . Smoking status: Former Research scientist (life sciences)  . Smokeless tobacco: Never Used  . Tobacco comment: smoked as teen, quit years ago  Substance Use Topics  . Alcohol use: No    Alcohol/week: 0.0 oz    Comment: socially  . Drug use: No     Allergies   Sulfa antibiotics and Leflunomide   Review of Systems Review of Systems  Constitutional: Positive for chills and fever.  HENT: Negative for congestion, rhinorrhea and sore throat.   Eyes: Negative for visual disturbance.  Respiratory: Negative for cough, shortness of breath and wheezing.   Cardiovascular: Negative for chest pain and leg swelling.  Gastrointestinal: Negative for abdominal pain, diarrhea, nausea and vomiting.  Genitourinary: Negative for dysuria, flank pain, vaginal bleeding and vaginal discharge.  Musculoskeletal: Negative for neck pain.  Skin: Positive for rash.  Allergic/Immunologic: Negative for immunocompromised state.  Neurological: Negative for syncope and headaches.  Hematological: Does not bruise/bleed easily.  All other systems reviewed and are negative.    Physical Exam Updated Vital Signs BP 135/83   Pulse 75   Temp 98 F (36.7 C) (Oral)   Resp 17   Ht 5\' 3"  (1.6 m)   Wt 74.3 kg (163 lb 14.4 oz)   LMP 10/24/2012   SpO2 100%   BMI 29.03 kg/m   Physical Exam  Constitutional: She is oriented to person, place, and time. She appears well-developed and well-nourished. No distress.  HENT:  Head: Normocephalic and atraumatic.  Eyes: Conjunctivae are normal.  Neck: Neck supple.  Cardiovascular: Normal rate, regular  rhythm and normal heart sounds. Exam reveals no friction rub.  No murmur heard. Pulmonary/Chest: Effort normal and breath sounds normal. No respiratory distress. She has no wheezes. She has no rales.  Abdominal: She exhibits no distension.  Musculoskeletal: She exhibits no edema.  Neurological: She is alert and oriented to person, place, and time. She exhibits normal muscle tone.  Skin: Skin is warm. Capillary refill takes less than 2 seconds.  Psychiatric: She has a normal mood and affect.  Nursing note and vitals reviewed.   LOWER EXTREMITY EXAM: LEFT  INSPECTION & PALPATION: Erythema extending well beyond margins of recently-marked cellulitis, focally most significant overlying patella and quad tendon insertion but extending to the ankle.  SENSORY: sensation is intact to light touch in:  Superficial peroneal nerve distribution (over dorsum of foot) Deep peroneal nerve distribution (over first dorsal web  space) Sural nerve distribution (over lateral aspect 5th metatarsal) Saphenous nerve distribution (over medial instep)  MOTOR:  + Motor EHL (great toe dorsiflexion) + FHL (great toe plantar flexion)  + TA (ankle dorsiflexion)  + GSC (ankle plantar flexion)  VASCULAR: 2+ dorsalis pedis and posterior tibialis pulses Capillary refill < 2 sec, toes warm and well-perfused  COMPARTMENTS: Soft, warm, well-perfused No pain with passive extension No parethesias         ED Treatments / Results  Labs (all labs ordered are listed, but only abnormal results are displayed) Labs Reviewed  COMPREHENSIVE METABOLIC PANEL - Abnormal; Notable for the following components:      Result Value   Potassium 3.4 (*)    Glucose, Bld 102 (*)    All other components within normal limits  URINALYSIS, ROUTINE W REFLEX MICROSCOPIC - Abnormal; Notable for the following components:   Leukocytes, UA TRACE (*)    All other components within normal limits  CULTURE, BLOOD (ROUTINE X 2)  CULTURE,  BLOOD (ROUTINE X 2)  CBC WITH DIFFERENTIAL/PLATELET  I-STAT CG4 LACTIC ACID, ED  I-STAT BETA HCG BLOOD, ED ([facility], [facility], [facility] ONLY)  I-STAT CG4 LACTIC ACID, ED    EKG None  Radiology No results found.  Procedures Procedures (including critical care time)  Medications Ordered in ED Medications  vancomycin (VANCOCIN) IVPB 1000 mg/200 mL premix (0 mg Intravenous Stopped 12/07/17 0429)  albuterol (PROVENTIL) (2.5 MG/3ML) 0.083% nebulizer solution 2.5 mg (has no administration in time range)  B-complex with vitamin C tablet 1 tablet (1 tablet Oral Given 12/07/17 0931)  cholecalciferol (VITAMIN D) tablet 1,000 Units (1,000 Units Oral Given 12/07/17 0931)  vitamin B-12 (CYANOCOBALAMIN) tablet 100 mcg (100 mcg Oral Given 12/07/17 0932)  dicyclomine (BENTYL) tablet 20 mg (20 mg Oral Given 12/07/17 0931)  gabapentin (NEURONTIN) capsule 300 mg (has no administration in time range)  ibuprofen (ADVIL,MOTRIN) tablet 800 mg (has no administration in time range)  metoprolol tartrate (LOPRESSOR) tablet 25 mg (25 mg Oral Given 12/07/17 0932)  pantoprazole (PROTONIX) EC tablet 40 mg (40 mg Oral Given 12/07/17 0932)  predniSONE (DELTASONE) tablet 5 mg (5 mg Oral Given 12/07/17 0933)  rosuvastatin (CRESTOR) tablet 20 mg (has no administration in time range)  sodium chloride flush (NS) 0.9 % injection 3 mL (3 mLs Intravenous Given 12/07/17 0934)  sodium chloride flush (NS) 0.9 % injection 3 mL (has no administration in time range)  0.9 %  sodium chloride infusion (has no administration in time range)  acetaminophen (TYLENOL) tablet 650 mg (has no administration in time range)    Or  acetaminophen (TYLENOL) suppository 650 mg (has no administration in time range)  senna-docusate (Senokot-S) tablet 1 tablet (has no administration in time range)  ondansetron (ZOFRAN) tablet 4 mg (has no administration in time range)    Or  ondansetron (ZOFRAN) injection 4 mg (has no administration in time range)  0.9 %  sodium  chloride infusion ( Intravenous Stopped 12/07/17 0500)  potassium chloride SA (K-DUR,KLOR-CON) CR tablet 40 mEq (40 mEq Oral Given 12/07/17 0524)     Initial Impression / Assessment and Plan / ED Course  I have reviewed the triage vital signs and the nursing notes.  Pertinent labs & imaging results that were available during my care of the patient were reviewed by me and considered in my medical decision making (see chart for details).     57 yo F here with recurrent LLE swelling, pain, and redness. S/p recent admission, now on Doxy.  On exam, pt does have streaking erythema along entire anterior lower leg, focally worse over the patella. Concern that she may have an underlying septic bursitis which continues to seed the area. H/o RA but exam is not c/w erythema nodosum or other rheum abnormality. Distribution is less c/w DVT. Will start IV Vanc, admit.  Final Clinical Impressions(s) / ED Diagnoses   Final diagnoses:  Cellulitis of left lower extremity    ED Discharge Orders    None       Duffy Bruce, MD 12/07/17 [phone]

## 2017-12-07 NOTE — Progress Notes (Signed)
Received report from RN in the ED.

## 2017-12-07 NOTE — Progress Notes (Signed)
Patient transferred from ED to 5W20. Patient A&Ox4. Skin assessment completed by 2 RNs. Patient instructed how to use the callbell before getting out of bed. Callbell within reach. Admission handbook given to patient. Will continue to monitor and treat per MD orders.

## 2017-12-07 NOTE — ED Triage Notes (Signed)
Pt was admitted this past week for cellulitis of the L leg, discharged yesterday and states that the redness and swelling are getting worse, denies fevers.

## 2017-12-07 NOTE — ED Notes (Signed)
Pt in room and alert, no family present VS documented Call light within reach

## 2017-12-07 NOTE — H&P (Signed)
History and Physical    Anna Carlson HUD:[id number] DOB: 08-18-1960 DOA: 12/07/2017  PCP: Benito Mccreedy, MD   Patient coming from: Home  Chief Complaint: Left leg redness   HPI: Anna Carlson is a 57 y.o. female with medical history significant for hypertension, rheumatoid arthritis, and recent admission for left lower extremity cellulitis, now returning to the emergency department with a expanding area of erythema involving the left lower leg despite continuing antibiotics as an outpatient.  Patient developed erythema involving the left lower leg anteriorly approximately 3 weeks ago, was started on Keflex after several days, failed to improve after a one-week course, and was continued on Keflex for a second week.  She reported continued progression of erythema despite the prolonged course of Keflex and was admitted to the hospital from 12/02/2017 until 12/05/2017, treated with IV doxycycline, evaluated by orthopedic surgery, improved without any surgical intervention, and was discharged home to complete a course of oral doxycycline.  She has not experienced any fevers or chills, but reports that the erythema has expanded beyond the outlines drawn during the recent admission.  There was not any fall or trauma prior to this illness and she had never experienced similar symptoms previously.  ED Course: Upon arrival to the ED, patient is found to be afebrile, saturating well on room air, and with vitals otherwise normal.  Chemistry panel features a slight hypokalemia and CBC is unremarkable.  Urinalysis is unremarkable and lactic acid is reassuringly normal.  Blood cultures were collected and the patient was started on vancomycin in the ED.  She remains hemodynamically stable, in no apparent respiratory distress, and will be observed on the medical-surgical unit for ongoing evaluation and management.  Review of Systems:  All other systems reviewed and apart from HPI, are negative.  Past  Medical History:  Diagnosis Date  . Angina   . Arthritis   . Bilateral leg pain   . Hypertension   . PVD (peripheral vascular disease) ([place]) 11/28/2013    Past Surgical History:  Procedure Laterality Date  . ARTHROSCOPY KNEE W/ DRILLING Right 2008  . foot surgery Bilateral 2008,2009     reports that she has quit smoking. She has never used smokeless tobacco. She reports that she does not drink alcohol or use drugs.  Allergies  Allergen Reactions  . Sulfa Antibiotics Swelling  . Leflunomide Rash    Mouth ulcers  Rash at injection site    Family History  Problem Relation Age of Onset  . Diabetes Mother   . Hypertension Mother   . Arthritis Mother   . Cataracts Mother   . Hyperlipidemia Father   . Diabetes Father   . Heart attack Father   . Diabetes type I Daughter      Prior to Admission medications   Medication Sig Start Date End Date Taking? Authorizing Provider  acetaminophen (TYLENOL) 325 MG tablet Take 2 tablets (650 mg total) by mouth every 6 (six) hours as needed for fever or headache. 12/05/17   Geradine Girt, DO  albuterol (PROAIR HFA) 108 (90 BASE) MCG/ACT inhaler Inhale 1-2 puffs into the lungs every 6 (six) hours as needed for wheezing or shortness of breath.    [provider]  aspirin EC 81 MG tablet Take 1 tablet (81 mg total) by mouth daily. 12/05/17   Geradine Girt, DO  b complex vitamins tablet Take 1 tablet by mouth daily.    [provider]  cholecalciferol (VITAMIN D) 1000 UNITS tablet Take  1,000 Units by mouth daily. 05/05/14   [provider]  cyanocobalamin 100 MCG tablet Take 100 mcg by mouth daily.    [provider]  dicyclomine (BENTYL) 20 MG tablet Take 20 mg by mouth every 6 (six) hours.    [provider]  doxycycline (VIBRA-TABS) 100 MG tablet Take 1 tablet (100 mg total) by mouth every 12 (twelve) hours. 12/05/17   Geradine Girt, DO  ferrous sulfate 325 (65 FE) MG tablet Take 325 mg by mouth  daily with breakfast.    [provider]  furosemide (LASIX) 20 MG tablet Take 20 mg by mouth daily.    [provider]  gabapentin (NEURONTIN) 300 MG capsule Take 300 mg by mouth at bedtime.     [provider]  ibuprofen (ADVIL,MOTRIN) 800 MG tablet TAKE 1 TABLET BY MOUTH EVERY 8 HOURS AS NEEDED Patient taking differently: TAKE 1 TABLET BY MOUTH EVERY 8 HOURS AS NEEDED FOR PAIN 09/17/15   Shelly Bombard, MD  metoprolol tartrate (LOPRESSOR) 25 MG tablet Take 25 mg by mouth 2 (two) times daily.  09/22/15   [provider]  MYRBETRIQ 50 MG TB24 tablet Take 50 mg by mouth daily. 11/07/17   [provider]  omeprazole (PRILOSEC) 40 MG capsule Take 40 mg by mouth daily as needed (heartburn).  05/27/14   [provider]  predniSONE (DELTASONE) 5 MG tablet Take 5 mg by mouth daily.  05/27/14   [provider]  rosuvastatin (CRESTOR) 20 MG tablet Take 20 mg by mouth every evening. 10/11/17   [provider]    Physical Exam: Vitals:   12/07/17 0021 12/07/17 0221 12/07/17 0345  BP: (!) 167/92 (!) 142/83 (!) 158/62  Pulse: 72 70 66  Resp: 20 17 20   Temp: 97.9 F (36.6 C) 97.7 F (36.5 C)   TempSrc: Oral Oral   SpO2: 99% 100% 98%      Constitutional: NAD, calm  Eyes: PERTLA, lids and conjunctivae normal ENMT: Mucous membranes are moist. Posterior pharynx clear of any exudate or lesions.   Neck: normal, supple, no masses, no thyromegaly Respiratory: clear to auscultation bilaterally, no wheezing, no crackles. Normal respiratory effort.    Cardiovascular: S1 & S2 heard, regular rate and rhythm.  No significant JVD. Abdomen: No distension, no tenderness, soft. Bowel sounds normal.  Musculoskeletal: no clubbing / cyanosis. No joint deformity upper and lower extremities.    Skin: Two erythematous nodules at anterior RLE distally. Faint erythema involving anterior LLE from knee to ankle.  Neurologic: CN 2-12 grossly intact.  Sensation intact. Strength 5/5 in all 4 limbs.  Psychiatric: Alert and oriented x 3. Calm, cooperative.      Labs on Admission: I have personally reviewed following labs and imaging studies  CBC: Recent Labs  Lab 12/02/17 1930 12/03/17 0527 12/05/17 0640 12/07/17 0031  WBC 9.1 7.9 8.5 8.3  NEUTROABS 6.4  --   --  5.4  HGB 13.5 12.6 14.1 13.1  HCT 42.7 40.1 45.2 42.0  MCV 89.3 90.5 91.5 89.4  PLT 231 221 220 998   Basic Metabolic Panel: Recent Labs  Lab 12/02/17 1930 12/03/17 0527 12/05/17 0640 12/07/17 0031  NA 142 143 144 143  K 3.5 3.9 3.7 3.4*  CL 106 111 109 109  CO2 26 26 29 26   GLUCOSE 101* 126* 83 102*  BUN 11 10 11 11   CREATININE 0.71 0.65 0.63 0.65  CALCIUM 8.8* 8.5* 9.3 8.9   GFR: Estimated Creatinine Clearance:  77.4 mL/min (by C-G formula based on SCr of 0.65 mg/dL). Liver Function Tests: Recent Labs  Lab 12/02/17 1930 12/07/17 0031  AST 26 22  ALT 28 25  ALKPHOS 60 60  BILITOT 0.4 0.6  PROT 7.0 6.5  ALBUMIN 3.8 3.8   No results for input(s): LIPASE, AMYLASE in the last 168 hours. No results for input(s): AMMONIA in the last 168 hours. Coagulation Profile: No results for input(s): INR, PROTIME in the last 168 hours. Cardiac Enzymes: No results for input(s): CKTOTAL, CKMB, CKMBINDEX, TROPONINI in the last 168 hours. BNP (last 3 results) No results for input(s): PROBNP in the last 8760 hours. HbA1C: No results for input(s): HGBA1C in the last 72 hours. CBG: No results for input(s): GLUCAP in the last 168 hours. Lipid Profile: No results for input(s): CHOL, HDL, LDLCALC, TRIG, CHOLHDL, LDLDIRECT in the last 72 hours. Thyroid Function Tests: No results for input(s): TSH, T4TOTAL, FREET4, T3FREE, THYROIDAB in the last 72 hours. Anemia Panel: No results for input(s): VITAMINB12, FOLATE, FERRITIN, TIBC, IRON, RETICCTPCT in the last 72 hours. Urine analysis:    Component Value Date/Time   COLORURINE YELLOW 12/07/2017 0030   APPEARANCEUR  CLEAR 12/07/2017 0030   LABSPEC 1.017 12/07/2017 0030   LABSPEC 1.030 04/05/2007 0949   PHURINE 5.0 12/07/2017 0030   GLUCOSEU NEGATIVE 12/07/2017 0030   HGBUR NEGATIVE 12/07/2017 0030   BILIRUBINUR NEGATIVE 12/07/2017 0030   BILIRUBINUR neg 09/21/2015 1206   BILIRUBINUR Negative 04/05/2007 0949   KETONESUR NEGATIVE 12/07/2017 0030   PROTEINUR NEGATIVE 12/07/2017 0030   UROBILINOGEN negative 09/21/2015 1206   UROBILINOGEN 0.2 02/06/2015 2154   NITRITE NEGATIVE 12/07/2017 0030   LEUKOCYTESUR TRACE (A) 12/07/2017 0030   LEUKOCYTESUR Trace 04/05/2007 0949   Sepsis Labs: @LABRCNTIP (procalcitonin:4,lacticidven:4) ) Recent Results (from the past 240 hour(s))  Culture, blood (routine x 2)     Status: None (Preliminary result)   Collection Time: 12/02/17  8:30 PM  Result Value Ref Range Status   Specimen Description BLOOD RIGHT FOREARM  Final   Special Requests   Final    BOTTLES DRAWN AEROBIC AND ANAEROBIC Blood Culture adequate volume   Culture   Final    NO GROWTH 4 DAYS Performed at [facility], [place] [address], [place], [place] [address]    Report Status PENDING  Incomplete  Culture, blood (routine x 2)     Status: None (Preliminary result)   Collection Time: 12/02/17  8:46 PM  Result Value Ref Range Status   Specimen Description BLOOD LEFT HAND  Final   Special Requests   Final    BOTTLES DRAWN AEROBIC AND ANAEROBIC Blood Culture adequate volume   Culture   Final    NO GROWTH 4 DAYS Performed at [facility], [place] [address], [place], [place] [address]    Report Status PENDING  Incomplete     Radiological Exams on Admission: No results found.  EKG: Not performed.   Assessment/Plan   1. Left leg cellulitis  - Presents with increasing erythema involving the lower left leg despite outpatient abx  - She was admitted from 6/16 to 6/19 after failing to respond to 14 days of Keflex, was treated with IV doxycycline, and discharged with oral doxycycline  - There  is no fever or leukocytosis and inflammatory markers essentially normal during recent admission, possibly due to her immunosuppression, but alternative diagnoses also considered such as EN or other dermatologic manifestation of RA; no calf tenderness or other evidence for DVT    -  Blood cultures were collected in ED and she was started on vancomycin  - Continue current antibiotic, follow cultures and clinical course   2. Rheumatoid arthritis  - Continue low-dose prednisone    3. Hypertension  - BP at goal  - Continue Lopressor    4. Hypokalemia  - Serum potassium is 3.4  - Treated with 40 mEq oral potassium    DVT prophylaxis: SCD's  Code Status: Full  Family Communication: Discussed with patient Consults called: None Admission status: Observation     Vianne Bulls, MD [facility] Pager [phone]  If 7PM-7AM, please contact night-coverage [email or web address] Password [facility]  12/07/2017, 4:09 AM

## 2017-12-07 NOTE — ED Notes (Signed)
Report given to 5W - 

## 2017-12-07 NOTE — Progress Notes (Signed)
Pharmacy Antibiotic Note  Anna Carlson is a 57 y.o. female admitted on 12/07/2017 with cellulitis.  Pharmacy has been consulted for Vancomycin dosing. Pt has failed PO Keflex and Doxycycline. WBC WNL. Renal function good.   Plan: Vancomycin 1000 mg IV q12h Trend WBC, temp, renal function  F/U infectious work-up Drug levels as indicated  Temp (24hrs), Avg:97.8 F (36.6 C), Min:97.7 F (36.5 C), Max:97.9 F (36.6 C)  Recent Labs  Lab 12/02/17 1930 12/02/17 1938 12/03/17 0527 12/05/17 0640 12/07/17 0031 12/07/17 0045  WBC 9.1  --  7.9 8.5 8.3  --   CREATININE 0.71  --  0.65 0.63 0.65  --   LATICACIDVEN  --  0.92  --   --   --  1.22    Estimated Creatinine Clearance: 77.4 mL/min (by C-G formula based on SCr of 0.65 mg/dL).    Allergies  Allergen Reactions  . Sulfa Antibiotics Swelling  . Leflunomide Rash    Mouth ulcers  Rash at injection site    Narda Bonds 12/07/2017 3:15 AM

## 2017-12-07 NOTE — Progress Notes (Signed)
Patient ID: Anna Carlson, female   DOB: 1960/08/04, 57 y.o.   MRN: [id number] Patient was admitted early this morning for left knee/leg redness after being discharged on the 19th for the same problem.  She has been started on IV vancomycin.  I seen and examined the patient at bedside and explained plan of care.  Continue current antibiotics.  Follow cultures.  I have reviewed the medical records including this morning's H&P myself.  I spoken to Dr. Jennell Corner about the patient and he will see the patient in consultation.  Repeat a.m. labs.

## 2017-12-07 NOTE — Consult Note (Signed)
ORTHOPAEDIC CONSULTATION  REQUESTING PHYSICIAN: Aline August, MD  Chief Complaint: left leg pain  HPI: Anna Carlson is a 57 y.o. female who complains of  Left knee and leg pain for several weeks. She was treated with outpatient keflex for 2+weeks, then admitted for IV antibiotics. She was discharged home, but presented to the ED three days later due to increasing areas of redness. No complaints of increasing pain or fever. No trauma or injury.  Past Medical History:  Diagnosis Date  . Angina   . Arthritis   . Bilateral leg pain   . Hypertension   . PVD (peripheral vascular disease) ([place]) 11/28/2013   Past Surgical History:  Procedure Laterality Date  . ARTHROSCOPY KNEE W/ DRILLING Right 2008  . foot surgery Bilateral 2008,2009   Social History   Socioeconomic History  . Marital status: Single    Spouse name: Not on file  . Number of children: 8  . Years of education: 89  . Highest education level: Not on file  Occupational History    Comment: home maker  Social Needs  . Financial resource strain: Not on file  . Food insecurity:    Worry: Not on file    Inability: Not on file  . Transportation needs:    Medical: Not on file    Non-medical: Not on file  Tobacco Use  . Smoking status: Former Research scientist (life sciences)  . Smokeless tobacco: Never Used  . Tobacco comment: smoked as teen, quit years ago  Substance and Sexual Activity  . Alcohol use: No    Alcohol/week: 0.0 oz    Comment: socially  . Drug use: No  . Sexual activity: Not Currently  Lifestyle  . Physical activity:    Days per week: Not on file    Minutes per session: Not on file  . Stress: Not on file  Relationships  . Social connections:    Talks on phone: Not on file    Gets together: Not on file    Attends religious service: Not on file    Active member of club or organization: Not on file    Attends meetings of clubs or organizations: Not on file    Relationship status: Not on file  Other Topics  Concern  . Not on file  Social History Narrative   Lives with daughter   Caffeine use- coffee, tea, soda: 2-3 daily   Family History  Problem Relation Age of Onset  . Diabetes Mother   . Hypertension Mother   . Arthritis Mother   . Cataracts Mother   . Hyperlipidemia Father   . Diabetes Father   . Heart attack Father   . Diabetes type I Daughter    Allergies  Allergen Reactions  . Sulfa Antibiotics Swelling  . Leflunomide Rash    Mouth ulcers  Rash at injection site   Prior to Admission medications   Medication Sig Start Date End Date Taking? Authorizing Provider  acetaminophen (TYLENOL) 325 MG tablet Take 2 tablets (650 mg total) by mouth every 6 (six) hours as needed for fever or headache. 12/05/17  Yes Vann, Jessica U, DO  albuterol (PROAIR HFA) 108 (90 BASE) MCG/ACT inhaler Inhale 1-2 puffs into the lungs every 6 (six) hours as needed for wheezing or shortness of breath.   Yes [provider]  b complex vitamins tablet Take 1 tablet by mouth daily.   Yes [provider]  cholecalciferol (VITAMIN D) 1000 UNITS tablet Take 1,000 Units by  mouth daily. 05/05/14  Yes [provider]  cyanocobalamin 100 MCG tablet Take 100 mcg by mouth daily.   Yes [provider]  dicyclomine (BENTYL) 20 MG tablet Take 20 mg by mouth every 6 (six) hours.   Yes [provider]  ferrous sulfate 325 (65 FE) MG tablet Take 325 mg by mouth daily with breakfast.   Yes [provider]  furosemide (LASIX) 20 MG tablet Take 20 mg by mouth daily.   Yes [provider]  gabapentin (NEURONTIN) 300 MG capsule Take 300 mg by mouth at bedtime.    Yes [provider]  ibuprofen (ADVIL,MOTRIN) 800 MG tablet TAKE 1 TABLET BY MOUTH EVERY 8 HOURS AS NEEDED Patient taking differently: TAKE 1 TABLET BY MOUTH EVERY 8 HOURS AS NEEDED FOR PAIN 09/17/15  Yes Shelly Bombard, MD  metoprolol tartrate (LOPRESSOR) 25 MG tablet Take 25 mg by mouth 2 (two)  times daily.  09/22/15  Yes [provider]  omeprazole (PRILOSEC) 40 MG capsule Take 40 mg by mouth daily as needed (heartburn).  05/27/14  Yes [provider]  predniSONE (DELTASONE) 5 MG tablet Take 5 mg by mouth daily.  05/27/14  Yes [provider]  rosuvastatin (CRESTOR) 20 MG tablet Take 20 mg by mouth every evening. 10/11/17  Yes [provider]  aspirin EC 81 MG tablet Take 1 tablet (81 mg total) by mouth daily. Patient not taking: Reported on 12/07/2017 12/05/17   Geradine Girt, DO  doxycycline (VIBRA-TABS) 100 MG tablet Take 1 tablet (100 mg total) by mouth every 12 (twelve) hours. 12/05/17   Eulogio Bear U, DO   No results found.  Positive ROS: All other systems have been reviewed and were otherwise negative with the exception of those mentioned in the HPI and as above.  Labs cbc Recent Labs    12/05/17 0640 12/07/17 0031  WBC 8.5 8.3  HGB 14.1 13.1  HCT 45.2 42.0  PLT 220 218    Labs inflam No results for input(s): CRP in the last 72 hours.  Invalid input(s): ESR  Labs coag No results for input(s): INR, PTT in the last 72 hours.  Invalid input(s): PT  Recent Labs    12/05/17 0640 12/07/17 0031  NA 144 143  K 3.7 3.4*  CL 109 109  CO2 29 26  GLUCOSE 83 102*  BUN 11 11  CREATININE 0.63 0.65  CALCIUM 9.3 8.9    Physical Exam: Vitals:   12/07/17 0450 12/07/17 0931  BP: (!) 147/67 135/83  Pulse: 74 75  Resp: 17   Temp: 98 F (36.7 C)   SpO2: 100%    General: Alert, no acute distress Cardiovascular: No pedal edema Respiratory: No cyanosis, no use of accessory musculature GI: No organomegaly, abdomen is soft and non-tender Skin: No lesions in the area of chief complaint other than those listed below in MSK exam.  Neurologic: Sensation intact distally save for the below mentioned MSK exam Psychiatric: Patient is competent for consent with normal mood and affect Lymphatic: No axillary or cervical  lymphadenopathy  MUSCULOSKELETAL:  Patient's left leg was examined by Dr Noemi Chapel and found to have no areas of fluctuance or skin necrosis. There is no effusion in left knee, or other sign of joint involvement. The areas of erythema present are improved per the patients recollection, and there is no evidence of sloughing.. Other extremities are atraumatic with painless ROM and NVI.  Assessment: Cellulitis left leg  Plan: Continue IV Vancomycin  May need to be discharged home on IV antibiotics, as per Hospitalists Weight Bearing Status: as tolerated PT VTE px: SCD's and foot pump exercises Will follow up in hospital if need arises, no further orthopedic interventions needed at this time  Patient may follow up with Dr Alain Marion as planned, or Dr Noemi Chapel in 1-2 weeks if she prefers  Nehemiah Massed, PA-C Cell [phone]   12/07/2017 10:52 AM

## 2017-12-08 DIAGNOSIS — Z8261 Family history of arthritis: Secondary | ICD-10-CM | POA: Diagnosis not present

## 2017-12-08 DIAGNOSIS — Z7982 Long term (current) use of aspirin: Secondary | ICD-10-CM | POA: Diagnosis not present

## 2017-12-08 DIAGNOSIS — Z882 Allergy status to sulfonamides status: Secondary | ICD-10-CM | POA: Diagnosis not present

## 2017-12-08 DIAGNOSIS — L03116 Cellulitis of left lower limb: Secondary | ICD-10-CM | POA: Diagnosis not present

## 2017-12-08 DIAGNOSIS — L52 Erythema nodosum: Secondary | ICD-10-CM | POA: Diagnosis present

## 2017-12-08 DIAGNOSIS — E876 Hypokalemia: Secondary | ICD-10-CM | POA: Diagnosis not present

## 2017-12-08 DIAGNOSIS — Z79899 Other long term (current) drug therapy: Secondary | ICD-10-CM | POA: Diagnosis not present

## 2017-12-08 DIAGNOSIS — M069 Rheumatoid arthritis, unspecified: Secondary | ICD-10-CM | POA: Diagnosis present

## 2017-12-08 DIAGNOSIS — Z7952 Long term (current) use of systemic steroids: Secondary | ICD-10-CM | POA: Diagnosis not present

## 2017-12-08 DIAGNOSIS — Z87891 Personal history of nicotine dependence: Secondary | ICD-10-CM | POA: Diagnosis not present

## 2017-12-08 DIAGNOSIS — M1990 Unspecified osteoarthritis, unspecified site: Secondary | ICD-10-CM | POA: Diagnosis present

## 2017-12-08 DIAGNOSIS — I10 Essential (primary) hypertension: Secondary | ICD-10-CM | POA: Diagnosis not present

## 2017-12-08 DIAGNOSIS — I739 Peripheral vascular disease, unspecified: Secondary | ICD-10-CM | POA: Diagnosis present

## 2017-12-08 DIAGNOSIS — Z888 Allergy status to other drugs, medicaments and biological substances status: Secondary | ICD-10-CM | POA: Diagnosis not present

## 2017-12-08 LAB — CBC WITH DIFFERENTIAL/PLATELET
Abs Immature Granulocytes: 0 10*3/uL (ref 0.0–0.1)
BASOS ABS: 0.1 10*3/uL (ref 0.0–0.1)
BASOS PCT: 1 %
EOS PCT: 3 %
Eosinophils Absolute: 0.2 10*3/uL (ref 0.0–0.7)
HCT: 40.3 % (ref 36.0–46.0)
HEMOGLOBIN: 12.8 g/dL (ref 12.0–15.0)
Immature Granulocytes: 0 %
Lymphocytes Relative: 26 %
Lymphs Abs: 1.9 10*3/uL (ref 0.7–4.0)
MCH: 28.4 pg (ref 26.0–34.0)
MCHC: 31.8 g/dL (ref 30.0–36.0)
MCV: 89.6 fL (ref 78.0–100.0)
MONO ABS: 0.6 10*3/uL (ref 0.1–1.0)
Monocytes Relative: 7 %
Neutro Abs: 4.7 10*3/uL (ref 1.7–7.7)
Neutrophils Relative %: 63 %
PLATELETS: 206 10*3/uL (ref 150–400)
RBC: 4.5 MIL/uL (ref 3.87–5.11)
RDW: 14.6 % (ref 11.5–15.5)
WBC: 7.4 10*3/uL (ref 4.0–10.5)

## 2017-12-08 LAB — BASIC METABOLIC PANEL
Anion gap: 8 (ref 5–15)
BUN: 11 mg/dL (ref 6–20)
CHLORIDE: 108 mmol/L (ref 101–111)
CO2: 25 mmol/L (ref 22–32)
CREATININE: 0.69 mg/dL (ref 0.44–1.00)
Calcium: 8.8 mg/dL — ABNORMAL LOW (ref 8.9–10.3)
GFR calc Af Amer: >60 mL/min (ref >60)
GFR calc non Af Amer: >60 mL/min (ref >60)
GLUCOSE: 117 mg/dL — AB (ref 65–99)
Potassium: 3.3 mmol/L — ABNORMAL LOW (ref 3.5–5.1)
SODIUM: 141 mmol/L (ref 135–145)

## 2017-12-08 LAB — C-REACTIVE PROTEIN: CRP: 1.1 mg/dL — ABNORMAL HIGH (ref <1.0)

## 2017-12-08 LAB — MAGNESIUM: MAGNESIUM: 2 mg/dL (ref 1.7–2.4)

## 2017-12-08 MED ORDER — POTASSIUM CHLORIDE CRYS ER 20 MEQ PO TBCR
60.0000 meq | EXTENDED_RELEASE_TABLET | Freq: Once | ORAL | Status: AC
  Administered 2017-12-08: 60 meq via ORAL
  Filled 2017-12-08: qty 3

## 2017-12-08 MED ORDER — SODIUM CHLORIDE 0.9 % IV SOLN
2.0000 g | INTRAVENOUS | Status: DC
  Administered 2017-12-08 – 2017-12-11 (×4): 2 g via INTRAVENOUS
  Filled 2017-12-08 (×5): qty 20

## 2017-12-08 NOTE — Progress Notes (Signed)
Patient ID: Anna Carlson, female   DOB: 20-Sep-1960, 57 y.o.   MRN: [id number]  PROGRESS NOTE    Anna Carlson  WEX:[id number] DOB: 1961-05-22 DOA: 12/07/2017 PCP: Benito Mccreedy, MD   Brief Narrative:  57 year old female with history of hypertension, rheumatoid arthritis and recent admission for left lower extremity cellulitis for which patient was hospitalized from 12/02/2017 until 12/05/2017 and sent home on oral doxycycline presented on 12/07/2017 for worsening left leg redness.  She was started on IV antibiotics.  Orthopedics was consulted.   Assessment & Plan:   Principal Problem:   Cellulitis of left leg Active Problems:   Rheumatoid arthritis ([place])   Essential hypertension   Hypokalemia   Left leg cellulitis   Left leg/knee cellulitis -Continue IV vancomycin.  Add Rocephin.  Orthopedics evaluation appreciated.  No need for any surgical intervention at this moment as per orthopedics.  Patient might need IV antibiotics to go home with if no improvement.  Patient might need outpatient dermatology evaluation to evaluate for recurrent cellulitis and might need skin biopsy -Afebrile, no leukocytosis  Hypokalemia -Replace and repeat a.m. labs  Rheumatoid arthritis -Continue low-dose prednisone  Hypertension -Monitor blood pressure.  Continue blood pressure   DVT prophylaxis: SCDs Code Status: Full Family Communication: None at bedside Disposition Plan: Home in 1 to 3 days  Consultants: Orthopedics  Procedures: None  Antimicrobials: Vancomycin from 12/06/2017 onwards   Subjective: Patient seen and examined at bedside.  She denies any worsening redness or pain of her left knee.  No overnight fever or vomiting.  Objective: Vitals:   12/07/17 0449 12/07/17 0450 12/07/17 0931 12/07/17 2145  BP:  (!) 147/67 135/83 128/74  Pulse:  74 75 69  Resp:  17  18  Temp:  98 F (36.7 C)  98 F (36.7 C)  TempSrc:  Oral  Oral  SpO2:  100%  96%  Weight: 74.3 kg (163  lb 14.4 oz)     Height: 5\' 3"  (1.6 m)       Intake/Output Summary (Last 24 hours) at 12/08/2017 0852 Last data filed at 12/07/2017 1700 Gross per 24 hour  Intake 444.71 ml  Output -  Net 444.71 ml   Filed Weights   12/07/17 0449  Weight: 74.3 kg (163 lb 14.4 oz)    Examination:  General exam: Appears calm and comfortable  Respiratory system: Bilateral decreased breath sound at bases Cardiovascular system: S1 & S2 heard, RRR.  Gastrointestinal system: Abdomen is nondistended, soft and nontender. Normal bowel sounds heard. Extremities: No cyanosis, clubbing, edema  Skin: Left knee an area just below the knee is erythematous with mild tenderness, no active discharge.  Few erythematous nodules in right lower extremity   Data Reviewed: I have personally reviewed following labs and imaging studies  CBC: Recent Labs  Lab 12/02/17 1930 12/03/17 0527 12/05/17 0640 12/07/17 0031 12/08/17 0422  WBC 9.1 7.9 8.5 8.3 7.4  NEUTROABS 6.4  --   --  5.4 4.7  HGB 13.5 12.6 14.1 13.1 12.8  HCT 42.7 40.1 45.2 42.0 40.3  MCV 89.3 90.5 91.5 89.4 89.6  PLT 231 221 220 218 938   Basic Metabolic Panel: Recent Labs  Lab 12/02/17 1930 12/03/17 0527 12/05/17 0640 12/07/17 0031 12/08/17 0422  NA 142 143 144 143 141  K 3.5 3.9 3.7 3.4* 3.3*  CL 106 111 109 109 108  CO2 26 26 29 26 25   GLUCOSE 101* 126* 83 102* 117*  BUN 11 10 11 11  11  CREATININE 0.71 0.65 0.63 0.65 0.69  CALCIUM 8.8* 8.5* 9.3 8.9 8.8*  MG  --   --   --   --  2.0   GFR: Estimated Creatinine Clearance: 77.7 mL/min (by C-G formula based on SCr of 0.69 mg/dL). Liver Function Tests: Recent Labs  Lab 12/02/17 1930 12/07/17 0031  AST 26 22  ALT 28 25  ALKPHOS 60 60  BILITOT 0.4 0.6  PROT 7.0 6.5  ALBUMIN 3.8 3.8   No results for input(s): LIPASE, AMYLASE in the last 168 hours. No results for input(s): AMMONIA in the last 168 hours. Coagulation Profile: No results for input(s): INR, PROTIME in the last 168  hours. Cardiac Enzymes: No results for input(s): CKTOTAL, CKMB, CKMBINDEX, TROPONINI in the last 168 hours. BNP (last 3 results) No results for input(s): PROBNP in the last 8760 hours. HbA1C: No results for input(s): HGBA1C in the last 72 hours. CBG: No results for input(s): GLUCAP in the last 168 hours. Lipid Profile: No results for input(s): CHOL, HDL, LDLCALC, TRIG, CHOLHDL, LDLDIRECT in the last 72 hours. Thyroid Function Tests: No results for input(s): TSH, T4TOTAL, FREET4, T3FREE, THYROIDAB in the last 72 hours. Anemia Panel: No results for input(s): VITAMINB12, FOLATE, FERRITIN, TIBC, IRON, RETICCTPCT in the last 72 hours. Sepsis Labs: Recent Labs  Lab 12/02/17 1938 12/07/17 0045 12/07/17 0351  LATICACIDVEN 0.92 1.22 0.74    Recent Results (from the past 240 hour(s))  Culture, blood (routine x 2)     Status: None   Collection Time: 12/02/17  8:30 PM  Result Value Ref Range Status   Specimen Description BLOOD RIGHT FOREARM  Final   Special Requests   Final    BOTTLES DRAWN AEROBIC AND ANAEROBIC Blood Culture adequate volume   Culture   Final    NO GROWTH 5 DAYS Performed at [facility], [address] [place], [place] [address]    Report Status 12/07/2017 FINAL  Final  Culture, blood (routine x 2)     Status: None   Collection Time: 12/02/17  8:46 PM  Result Value Ref Range Status   Specimen Description BLOOD LEFT HAND  Final   Special Requests   Final    BOTTLES DRAWN AEROBIC AND ANAEROBIC Blood Culture adequate volume   Culture   Final    NO GROWTH 5 DAYS Performed at [facility], [place] [address] [place], [place] [address]    Report Status 12/07/2017 FINAL  Final         Radiology Studies: No results found.      Scheduled Meds: . B-complex with vitamin C  1 tablet Oral Daily  . cholecalciferol  1,000 Units Oral Daily  . dicyclomine  20 mg Oral TID AC & HS  . gabapentin  300 mg Oral QHS  . metoprolol tartrate  25 mg Oral BID  .  pantoprazole  40 mg Oral Daily  . potassium chloride  60 mEq Oral Once  . predniSONE  5 mg Oral Q breakfast  . rosuvastatin  20 mg Oral QPM  . sodium chloride flush  3 mL Intravenous Q12H  . cyanocobalamin  100 mcg Oral Daily   Continuous Infusions: . sodium chloride 250 mL (12/08/17 0236)  . vancomycin 1,000 mg (12/08/17 0239)     LOS: 0 days        Aline August, MD [facility] Pager [phone]  If 7PM-7AM, please contact night-coverage [email or web address] Password [facility] 12/08/2017, 8:52 AM

## 2017-12-09 LAB — CBC WITH DIFFERENTIAL/PLATELET
Abs Immature Granulocytes: 0 10*3/uL (ref 0.0–0.1)
BASOS ABS: 0.1 10*3/uL (ref 0.0–0.1)
Basophils Relative: 1 %
EOS ABS: 0.2 10*3/uL (ref 0.0–0.7)
Eosinophils Relative: 3 %
HCT: 40.8 % (ref 36.0–46.0)
Hemoglobin: 12.8 g/dL (ref 12.0–15.0)
IMMATURE GRANULOCYTES: 0 %
LYMPHS PCT: 19 %
Lymphs Abs: 1.5 10*3/uL (ref 0.7–4.0)
MCH: 28.1 pg (ref 26.0–34.0)
MCHC: 31.4 g/dL (ref 30.0–36.0)
MCV: 89.7 fL (ref 78.0–100.0)
Monocytes Absolute: 0.7 10*3/uL (ref 0.1–1.0)
Monocytes Relative: 8 %
NEUTROS PCT: 69 %
Neutro Abs: 5.4 10*3/uL (ref 1.7–7.7)
Platelets: 207 10*3/uL (ref 150–400)
RBC: 4.55 MIL/uL (ref 3.87–5.11)
RDW: 14.6 % (ref 11.5–15.5)
WBC: 7.8 10*3/uL (ref 4.0–10.5)

## 2017-12-09 LAB — BASIC METABOLIC PANEL
ANION GAP: 9 (ref 5–15)
BUN: 15 mg/dL (ref 6–20)
CHLORIDE: 109 mmol/L (ref 101–111)
CO2: 22 mmol/L (ref 22–32)
Calcium: 8.6 mg/dL — ABNORMAL LOW (ref 8.9–10.3)
Creatinine, Ser: 0.62 mg/dL (ref 0.44–1.00)
GFR calc non Af Amer: >60 mL/min (ref >60)
Glucose, Bld: 75 mg/dL (ref 65–99)
POTASSIUM: 3.6 mmol/L (ref 3.5–5.1)
SODIUM: 140 mmol/L (ref 135–145)

## 2017-12-09 LAB — MAGNESIUM: MAGNESIUM: 1.9 mg/dL (ref 1.7–2.4)

## 2017-12-09 NOTE — Progress Notes (Signed)
Patient ID: Anna Carlson, female   DOB: [birth year], 57 y.o.   MRN: [id number]  PROGRESS NOTE    REILLY MOLCHAN  JYN:[id number] DOB: Sep 07, 1960 DOA: 12/07/2017 PCP: Benito Mccreedy, MD   Brief Narrative:  57 year old female with history of hypertension, rheumatoid arthritis and recent admission for left lower extremity cellulitis for which patient was hospitalized from 12/02/2017 until 12/05/2017 and sent home on oral doxycycline presented on 12/07/2017 for worsening left leg redness.  She was started on IV antibiotics.  Orthopedics was consulted.   Assessment & Plan:   Principal Problem:   Cellulitis of left leg Active Problems:   Rheumatoid arthritis ([place])   Essential hypertension   Hypokalemia   Left leg cellulitis   Left leg/knee cellulitis -Continue IV vancomycin and Rocephin.  Orthopedics evaluation appreciated.  No need for any surgical intervention at this moment as per orthopedics.  Patient might need IV antibiotics to go home with if no improvement.  Patient might need outpatient dermatology evaluation to evaluate for recurrent cellulitis and might need skin biopsy -Cellulitis improving.  Lower extremity pain improving.  Afebrile, no leukocytosis  Hypokalemia -Labs pending today.  Rheumatoid arthritis -Continue low-dose prednisone  Hypertension -Monitor blood pressure.  Continue blood pressure   DVT prophylaxis: SCDs Code Status: Full Family Communication: None at bedside Disposition Plan: Home in 1 to 2 days  Consultants: Orthopedics  Procedures: None  Antimicrobials: Vancomycin from 12/06/2017 onwards Rocephin from 12/08/2017 onwards   Subjective: Patient seen and examined at bedside.  She feels that her left knee pain and swelling and redness is better.  No overnight fever, nausea or vomiting. Objective: Vitals:   12/08/17 1439 12/08/17 1440 12/08/17 2205 12/09/17 0544  BP: 140/76  122/80 109/61  Pulse: 75  79 63  Resp:   18 18  Temp: (!) 97.3  F (36.3 C) 97.6 F (36.4 C) 98.1 F (36.7 C) 98.2 F (36.8 C)  TempSrc: Oral Oral  Oral  SpO2: 97%  99% 100%  Weight:      Height:        Intake/Output Summary (Last 24 hours) at 12/09/2017 0930 Last data filed at 12/09/2017 0353 Gross per 24 hour  Intake 828.58 ml  Output -  Net 828.58 ml   Filed Weights   12/07/17 0449  Weight: 74.3 kg (163 lb 14.4 oz)    Examination:  General exam: Appears calm and comfortable, no distress Respiratory system: Bilateral decreased breath sound at bases, no wheezing Cardiovascular system: S1 & S2 heard, RRR.  Gastrointestinal system: Abdomen is nondistended, soft and nontender. Normal bowel sounds heard. Extremities: No cyanosis, edema  Skin: Left knee an area just below the knee erythema and tenderness are improving, no active discharge.    Data Reviewed: I have personally reviewed following labs and imaging studies  CBC: Recent Labs  Lab 12/02/17 1930 12/03/17 0527 12/05/17 0640 12/07/17 0031 12/08/17 0422  WBC 9.1 7.9 8.5 8.3 7.4  NEUTROABS 6.4  --   --  5.4 4.7  HGB 13.5 12.6 14.1 13.1 12.8  HCT 42.7 40.1 45.2 42.0 40.3  MCV 89.3 90.5 91.5 89.4 89.6  PLT 231 221 220 218 865   Basic Metabolic Panel: Recent Labs  Lab 12/02/17 1930 12/03/17 0527 12/05/17 0640 12/07/17 0031 12/08/17 0422  NA 142 143 144 143 141  K 3.5 3.9 3.7 3.4* 3.3*  CL 106 111 109 109 108  CO2 26 26 29 26 25   GLUCOSE 101* 126* 83 102* 117*  BUN 11 10  11 11 11   CREATININE 0.71 0.65 0.63 0.65 0.69  CALCIUM 8.8* 8.5* 9.3 8.9 8.8*  MG  --   --   --   --  2.0   GFR: Estimated Creatinine Clearance: 77.7 mL/min (by C-G formula based on SCr of 0.69 mg/dL). Liver Function Tests: Recent Labs  Lab 12/02/17 1930 12/07/17 0031  AST 26 22  ALT 28 25  ALKPHOS 60 60  BILITOT 0.4 0.6  PROT 7.0 6.5  ALBUMIN 3.8 3.8   No results for input(s): LIPASE, AMYLASE in the last 168 hours. No results for input(s): AMMONIA in the last 168 hours. Coagulation  Profile: No results for input(s): INR, PROTIME in the last 168 hours. Cardiac Enzymes: No results for input(s): CKTOTAL, CKMB, CKMBINDEX, TROPONINI in the last 168 hours. BNP (last 3 results) No results for input(s): PROBNP in the last 8760 hours. HbA1C: No results for input(s): HGBA1C in the last 72 hours. CBG: No results for input(s): GLUCAP in the last 168 hours. Lipid Profile: No results for input(s): CHOL, HDL, LDLCALC, TRIG, CHOLHDL, LDLDIRECT in the last 72 hours. Thyroid Function Tests: No results for input(s): TSH, T4TOTAL, FREET4, T3FREE, THYROIDAB in the last 72 hours. Anemia Panel: No results for input(s): VITAMINB12, FOLATE, FERRITIN, TIBC, IRON, RETICCTPCT in the last 72 hours. Sepsis Labs: Recent Labs  Lab 12/02/17 1938 12/07/17 0045 12/07/17 0351  LATICACIDVEN 0.92 1.22 0.74    Recent Results (from the past 240 hour(s))  Culture, blood (routine x 2)     Status: None   Collection Time: 12/02/17  8:30 PM  Result Value Ref Range Status   Specimen Description BLOOD RIGHT FOREARM  Final   Special Requests   Final    BOTTLES DRAWN AEROBIC AND ANAEROBIC Blood Culture adequate volume   Culture   Final    NO GROWTH 5 DAYS Performed at [facility], [address] [place], [place] [address]    Report Status 12/07/2017 FINAL  Final  Culture, blood (routine x 2)     Status: None   Collection Time: 12/02/17  8:46 PM  Result Value Ref Range Status   Specimen Description BLOOD LEFT HAND  Final   Special Requests   Final    BOTTLES DRAWN AEROBIC AND ANAEROBIC Blood Culture adequate volume   Culture   Final    NO GROWTH 5 DAYS Performed at [facility], [place] [address], [place], [place] [address]    Report Status 12/07/2017 FINAL  Final  Blood culture (routine x 2)     Status: None (Preliminary result)   Collection Time: 12/07/17  3:15 AM  Result Value Ref Range Status   Specimen Description BLOOD RIGHT HAND  Final   Special Requests   Final    BOTTLES  DRAWN AEROBIC ONLY Blood Culture results may not be optimal due to an inadequate volume of blood received in culture bottles   Culture   Final    NO GROWTH 1 DAY Performed at [facility], [place] [address] [place], [place] [address]    Report Status PENDING  Incomplete  Blood culture (routine x 2)     Status: None (Preliminary result)   Collection Time: 12/07/17  3:25 AM  Result Value Ref Range Status   Specimen Description BLOOD LEFT ARM  Final   Special Requests   Final    BOTTLES DRAWN AEROBIC AND ANAEROBIC Blood Culture results may not be optimal due to an excessive volume of blood received in  culture bottles   Culture   Final    NO GROWTH 1 DAY Performed at [facility], [place] [address], [place], [place] [address]    Report Status PENDING  Incomplete         Radiology Studies: No results found.      Scheduled Meds: . B-complex with vitamin C  1 tablet Oral Daily  . cholecalciferol  1,000 Units Oral Daily  . dicyclomine  20 mg Oral TID AC & HS  . gabapentin  300 mg Oral QHS  . metoprolol tartrate  25 mg Oral BID  . pantoprazole  40 mg Oral Daily  . predniSONE  5 mg Oral Q breakfast  . rosuvastatin  20 mg Oral QPM  . sodium chloride flush  3 mL Intravenous Q12H  . cyanocobalamin  100 mcg Oral Daily   Continuous Infusions: . sodium chloride 250 mL (12/08/17 0236)  . cefTRIAXone (ROCEPHIN)  IV 2 g (12/08/17 1004)  . vancomycin 1,000 mg (12/09/17 0251)     LOS: 1 day        Aline August, MD [facility] Pager [phone]  If 7PM-7AM, please contact night-coverage [email or web address] Password [email or web address] 12/09/2017, 9:30 AM

## 2017-12-09 NOTE — Evaluation (Signed)
Physical Therapy Evaluation Patient Details Name: Anna Carlson MRN: [id number] DOB: [birth year] Today's Date: 12/09/2017   History of Present Illness  57 year old female with history of hypertension, rheumatoid arthritis and recent admission for left lower extremity cellulitis for which patient was hospitalized from 12/02/2017 until 12/05/2017 and sent home on oral doxycycline presented on 12/07/2017 for worsening left leg redness.  Clinical Impression   Pt admitted with above diagnosis. Pt currently with functional limitations due to the deficits listed below (see PT Problem List). Painful LLE with amb; notable that RW helps with amb and activity tolerance;  Pt will benefit from skilled PT to increase their independence and safety with mobility to allow discharge to the venue listed below.       Follow Up Recommendations Home health PT    Equipment Recommendations  Rolling walker with 5" wheels    Recommendations for Other Services       Precautions / Restrictions Precautions Precaution Comments: Keep LLE elevated when not walking      Mobility  Bed Mobility Overal bed mobility: Needs Assistance Bed Mobility: Sit to Supine       Sit to supine: Supervision   General bed mobility comments: Cues for techqniue  Transfers Overall transfer level: Needs assistance Equipment used: None Transfers: Sit to/from Stand Sit to Stand: Min guard         General transfer comment: Noted dependence on UE support  Ambulation/Gait Ambulation/Gait assistance: Min guard Gait Distance (Feet): 150 Feet Assistive device: Rolling walker (2 wheeled) Gait Pattern/deviations: Decreased stance time - left;Decreased step length - right     General Gait Details: Initiated walk without RW and noted pt tends to reach out for UE support; then used RW to [place] painful LLE in stance, and it proved ot be helpful  Stairs Stairs: Yes Stairs assistance: Min guard Stair Management: One rail  Right;Step to pattern;Forwards Number of Stairs: 2 General stair comments: Cues for sequence  Wheelchair Mobility    Modified Rankin (Stroke Patients Only)       Balance                                             Pertinent Vitals/Pain Pain Assessment: 0-10 Pain Score: 5  Pain Location: LLE Pain Descriptors / Indicators: Aching Pain Intervention(s): Monitored during session;Other (comment)(Elevated LLE after walking)    Home Living Family/patient expects to be discharged to:: Private residence Living Arrangements: Children Available Help at Discharge: Family;Available PRN/intermittently Type of Home: House Home Access: Stairs to enter   Entrance Stairs-Number of Steps: 2 Home Layout: Two level        Prior Function Level of Independence: Independent               Hand Dominance        Extremity/Trunk Assessment   Upper Extremity Assessment Upper Extremity Assessment: Overall WFL for tasks assessed    Lower Extremity Assessment Lower Extremity Assessment: LLE deficits/detail LLE Deficits / Details: knee stiffness and erythema (marked earlier); more red after amb       Communication   Communication: No difficulties  Cognition Arousal/Alertness: Awake/alert Behavior During Therapy: WFL for tasks assessed/performed Overall Cognitive Status: Within Functional Limits for tasks assessed  General Comments      Exercises     Assessment/Plan    PT Assessment Patient needs continued PT services  PT Problem List Decreased strength;Decreased range of motion;Decreased activity tolerance;Decreased balance;Decreased mobility;Decreased knowledge of use of DME;Decreased knowledge of precautions;Pain       PT Treatment Interventions DME instruction;Gait training;Stair training;Functional mobility training;Therapeutic activities;Therapeutic exercise;Balance training;Patient/family  education    PT Goals (Current goals can be found in the Care Plan section)  Acute Rehab PT Goals Patient Stated Goal: heal PT Goal Formulation: With patient Time For Goal Achievement: 12/23/17 Potential to Achieve Goals: Good    Frequency Min 3X/week   Barriers to discharge        Co-evaluation               AM-PAC PT "6 Clicks" Daily Activity  Outcome Measure Difficulty turning over in bed (including adjusting bedclothes, sheets and blankets)?: A Little Difficulty moving from lying on back to sitting on the side of the bed? : A Little Difficulty sitting down on and standing up from a chair with arms (e.g., wheelchair, bedside commode, etc,.)?: A Little Help needed moving to and from a bed to chair (including a wheelchair)?: None Help needed walking in hospital room?: None Help needed climbing 3-5 steps with a railing? : A Little 6 Click Score: 20    End of Session Equipment Utilized During Treatment: Gait belt Activity Tolerance: Patient tolerated treatment well Patient left: in bed;with call bell/phone within reach;with family/visitor present Nurse Communication: Mobility status PT Visit Diagnosis: Other abnormalities of gait and mobility (R26.89);Pain Pain - Right/Left: Left Pain - part of body: Leg    Time: 1439-1500 PT Time Calculation (min) (ACUTE ONLY): 21 min   Charges:   PT Evaluation $PT Eval Moderate Complexity: 1 Mod     PT G Codes:        Roney Marion, PT  Acute Rehabilitation Services Pager [phone] Office [phone]   [place] 12/09/2017, 4:47 PM

## 2017-12-10 ENCOUNTER — Inpatient Hospital Stay (HOSPITAL_COMMUNITY): Payer: Medicaid Other

## 2017-12-10 ENCOUNTER — Encounter (HOSPITAL_COMMUNITY): Payer: Self-pay | Admitting: Radiology

## 2017-12-10 LAB — BASIC METABOLIC PANEL
ANION GAP: 11 (ref 5–15)
BUN: 12 mg/dL (ref 6–20)
CALCIUM: 8.9 mg/dL (ref 8.9–10.3)
CO2: 27 mmol/L (ref 22–32)
Chloride: 105 mmol/L (ref 101–111)
Creatinine, Ser: 0.67 mg/dL (ref 0.44–1.00)
GFR calc Af Amer: >60 mL/min (ref >60)
GFR calc non Af Amer: >60 mL/min (ref >60)
GLUCOSE: 86 mg/dL (ref 65–99)
Potassium: 3.5 mmol/L (ref 3.5–5.1)
Sodium: 143 mmol/L (ref 135–145)

## 2017-12-10 LAB — MAGNESIUM: Magnesium: 2 mg/dL (ref 1.7–2.4)

## 2017-12-10 LAB — VANCOMYCIN, TROUGH: VANCOMYCIN TR: 9 ug/mL — AB (ref 15–20)

## 2017-12-10 LAB — C-REACTIVE PROTEIN: CRP: 1.7 mg/dL — ABNORMAL HIGH (ref <1.0)

## 2017-12-10 MED ORDER — VANCOMYCIN HCL 10 G IV SOLR
1250.0000 mg | Freq: Two times a day (BID) | INTRAVENOUS | Status: DC
  Administered 2017-12-11 – 2017-12-12 (×3): 1250 mg via INTRAVENOUS
  Filled 2017-12-10 (×4): qty 1250

## 2017-12-10 MED ORDER — IOHEXOL 300 MG/ML  SOLN
75.0000 mL | Freq: Once | INTRAMUSCULAR | Status: AC | PRN
  Administered 2017-12-10: 75 mL via INTRAVENOUS

## 2017-12-10 MED ORDER — DIPHENHYDRAMINE HCL 50 MG/ML IJ SOLN
25.0000 mg | Freq: Four times a day (QID) | INTRAMUSCULAR | Status: DC | PRN
  Administered 2017-12-11: 25 mg via INTRAVENOUS
  Filled 2017-12-10: qty 1

## 2017-12-10 MED ORDER — IOHEXOL 300 MG/ML  SOLN: 100.0000 mL | Freq: Once | INTRAMUSCULAR | Status: DC

## 2017-12-10 NOTE — Progress Notes (Signed)
Patient ID: Anna Carlson, female   DOB: 01-Mar-1961, 57 y.o.   MRN: [id number]  PROGRESS NOTE    Anna Carlson  JSE:[id number] DOB: Sep 18, 1960 DOA: 12/07/2017 PCP: Benito Mccreedy, MD   Brief Narrative:  57 year old female with history of hypertension, rheumatoid arthritis and recent admission for left lower extremity cellulitis for which patient was hospitalized from 12/02/2017 until 12/05/2017 and sent home on oral doxycycline presented on 12/07/2017 for worsening left leg redness.  She was started on IV antibiotics.  Orthopedics was consulted.   Assessment & Plan:   Principal Problem:   Cellulitis of left leg Active Problems:   Rheumatoid arthritis ([place])   Essential hypertension   Hypokalemia   Left leg cellulitis   Left leg/knee cellulitis -Continue IV vancomycin and Rocephin.  Orthopedics evaluation appreciated.  No need for any surgical intervention at this moment as per orthopedics.  Patient might need IV antibiotics to go home with if no improvement.  Patient might need outpatient dermatology evaluation to evaluate for recurrent cellulitis and might need skin biopsy -Patient still has persistent mild erythema with tenderness.  Will get CT scan of the knee with contrast.  Depending on the results, might need orthopedics evaluation.  Hypokalemia -Improved.  Rheumatoid arthritis -Continue low-dose prednisone  Hypertension -Monitor blood pressure.  Continue blood pressure   DVT prophylaxis: SCDs Code Status: Full Family Communication: None at bedside Disposition Plan: Home in 1 to 2 days  Consultants: Orthopedics  Procedures: None  Antimicrobials: Vancomycin from 12/06/2017 onwards Rocephin from 12/08/2017 onwards   Subjective: Patient seen and examined at bedside.  She still complains of left knee pain and has difficulty walking.  Overnight fever, nausea or vomiting.  Objective: Vitals:   12/09/17 1615 12/09/17 2054 12/10/17 0451 12/10/17 0915  BP: (!)  114/52 117/76 119/72 120/80  Pulse: 66 77 75 70  Resp: 18 18 16    Temp: 97.7 F (36.5 C) 98.3 F (36.8 C) 98.7 F (37.1 C)   TempSrc: Oral Oral Oral   SpO2: 98% 98% 100%   Weight:      Height:        Intake/Output Summary (Last 24 hours) at 12/10/2017 1026 Last data filed at 12/10/2017 0737 Gross per 24 hour  Intake 877.12 ml  Output -  Net 877.12 ml   Filed Weights   12/07/17 0449  Weight: 74.3 kg (163 lb 14.4 oz)    Examination:  General exam: Appears calm and comfortable Respiratory system: Bilateral decreased breath sound at bases Cardiovascular system: S1 & S2 heard, rate controlled Gastrointestinal system: Abdomen is nondistended, soft and nontender. Normal bowel sounds heard. Extremities: No cyanosis, edema  Skin: Left knee and area just below the knee has persistent mild erythema and tenderness, no fluctuance, no active discharge.    Data Reviewed: I have personally reviewed following labs and imaging studies  CBC: Recent Labs  Lab 12/05/17 0640 12/07/17 0031 12/08/17 0422 12/09/17 0804  WBC 8.5 8.3 7.4 7.8  NEUTROABS  --  5.4 4.7 5.4  HGB 14.1 13.1 12.8 12.8  HCT 45.2 42.0 40.3 40.8  MCV 91.5 89.4 89.6 89.7  PLT 220 218 206 106   Basic Metabolic Panel: Recent Labs  Lab 12/05/17 0640 12/07/17 0031 12/08/17 0422 12/09/17 0804 12/10/17 0525  NA 144 143 141 140 143  K 3.7 3.4* 3.3* 3.6 3.5  CL 109 109 108 109 105  CO2 29 26 25 22 27   GLUCOSE 83 102* 117* 75 86  BUN 11 11 11  15  12  CREATININE 0.63 0.65 0.69 0.62 0.67  CALCIUM 9.3 8.9 8.8* 8.6* 8.9  MG  --   --  2.0 1.9 2.0   GFR: Estimated Creatinine Clearance: 77.7 mL/min (by C-G formula based on SCr of 0.67 mg/dL). Liver Function Tests: Recent Labs  Lab 12/07/17 0031  AST 22  ALT 25  ALKPHOS 60  BILITOT 0.6  PROT 6.5  ALBUMIN 3.8   No results for input(s): LIPASE, AMYLASE in the last 168 hours. No results for input(s): AMMONIA in the last 168 hours. Coagulation Profile: No  results for input(s): INR, PROTIME in the last 168 hours. Cardiac Enzymes: No results for input(s): CKTOTAL, CKMB, CKMBINDEX, TROPONINI in the last 168 hours. BNP (last 3 results) No results for input(s): PROBNP in the last 8760 hours. HbA1C: No results for input(s): HGBA1C in the last 72 hours. CBG: No results for input(s): GLUCAP in the last 168 hours. Lipid Profile: No results for input(s): CHOL, HDL, LDLCALC, TRIG, CHOLHDL, LDLDIRECT in the last 72 hours. Thyroid Function Tests: No results for input(s): TSH, T4TOTAL, FREET4, T3FREE, THYROIDAB in the last 72 hours. Anemia Panel: No results for input(s): VITAMINB12, FOLATE, FERRITIN, TIBC, IRON, RETICCTPCT in the last 72 hours. Sepsis Labs: Recent Labs  Lab 12/07/17 0045 12/07/17 0351  LATICACIDVEN 1.22 0.74    Recent Results (from the past 240 hour(s))  Culture, blood (routine x 2)     Status: None   Collection Time: 12/02/17  8:30 PM  Result Value Ref Range Status   Specimen Description BLOOD RIGHT FOREARM  Final   Special Requests   Final    BOTTLES DRAWN AEROBIC AND ANAEROBIC Blood Culture adequate volume   Culture   Final    NO GROWTH 5 DAYS Performed at [facility], [address], [place], [place] [address]    Report Status 12/07/2017 FINAL  Final  Culture, blood (routine x 2)     Status: None   Collection Time: 12/02/17  8:46 PM  Result Value Ref Range Status   Specimen Description BLOOD LEFT HAND  Final   Special Requests   Final    BOTTLES DRAWN AEROBIC AND ANAEROBIC Blood Culture adequate volume   Culture   Final    NO GROWTH 5 DAYS Performed at [facility], [place] [address] [place], [place] [address]    Report Status 12/07/2017 FINAL  Final  Blood culture (routine x 2)     Status: None (Preliminary result)   Collection Time: 12/07/17  3:15 AM  Result Value Ref Range Status   Specimen Description BLOOD RIGHT HAND  Final   Special Requests   Final    BOTTLES DRAWN AEROBIC ONLY Blood Culture  results may not be optimal due to an inadequate volume of blood received in culture bottles   Culture   Final    NO GROWTH 2 DAYS Performed at [facility], [place] [address], [place], [place] [address]    Report Status PENDING  Incomplete  Blood culture (routine x 2)     Status: None (Preliminary result)   Collection Time: 12/07/17  3:25 AM  Result Value Ref Range Status   Specimen Description BLOOD LEFT ARM  Final   Special Requests   Final    BOTTLES DRAWN AEROBIC AND ANAEROBIC Blood Culture results may not be optimal due to an excessive volume of blood received in culture bottles   Culture   Final    NO GROWTH 2 DAYS Performed at  [facility], [place] [address] [place], [place] [address]    Report Status PENDING  Incomplete         Radiology Studies: No results found.      Scheduled Meds: . B-complex with vitamin C  1 tablet Oral Daily  . cholecalciferol  1,000 Units Oral Daily  . dicyclomine  20 mg Oral TID AC & HS  . gabapentin  300 mg Oral QHS  . metoprolol tartrate  25 mg Oral BID  . pantoprazole  40 mg Oral Daily  . predniSONE  5 mg Oral Q breakfast  . rosuvastatin  20 mg Oral QPM  . sodium chloride flush  3 mL Intravenous Q12H  . cyanocobalamin  100 mcg Oral Daily   Continuous Infusions: . sodium chloride 250 mL (12/10/17 0909)  . cefTRIAXone (ROCEPHIN)  IV 2 g (12/10/17 0914)  . vancomycin Stopped (12/10/17 0421)     LOS: 2 days        Aline August, MD [facility] Pager [phone]  If 7PM-7AM, please contact night-coverage [email or web address] Password [facility] 12/10/2017, 10:26 AM

## 2017-12-10 NOTE — Progress Notes (Signed)
ANTIBIOTIC CONSULT NOTE   Pharmacy Consult for Vancomycin Indication: cellulitis  Allergies  Allergen Reactions  . Sulfa Antibiotics Swelling  . Leflunomide Rash    Mouth ulcers  Rash at injection site    Patient Measurements: Height: 5\' 3"  (160 cm) Weight: 163 lb 14.4 oz (74.3 kg) IBW/kg (Calculated) : 52.4 Adjusted Body Weight:    Vital Signs: Temp: 98 F (36.7 C) (06/24 1319) Temp Source: Oral (06/24 1319) BP: 116/78 (06/24 1319) Pulse Rate: 74 (06/24 1319) Intake/Output from previous day: 06/23 0701 - 06/24 0700 In: 677.1 [I.V.:381.4; IV Piggyback:295.7] Out: -  Intake/Output from this shift: Total I/O In: 529.5 [P.O.:400; I.V.:29.5; IV Piggyback:100] Out: -   Labs: Recent Labs    12/08/17 0422 12/09/17 0804 12/10/17 0525  WBC 7.4 7.8  --   HGB 12.8 12.8  --   PLT 206 207  --   CREATININE 0.69 0.62 0.67   Estimated Creatinine Clearance: 77.7 mL/min (by C-G formula based on SCr of 0.67 mg/dL). Recent Labs    12/10/17 1429  [place] 9*     Microbiology:   Medical History: Past Medical History:  Diagnosis Date  . Angina   . Arthritis   . Bilateral leg pain   . Hypertension   . PVD (peripheral vascular disease) ([place]) 11/28/2013    Assessment:   ID: L leg/knee cellulitis (recently treated x 2weeks w/ keflex outpt, then admitted and D/C with doxy Po, returned 3 days later) WBC 7.8, AF , Scr WNL,  Rocephin 6/22> Vanco 6/22> --6/24 VT= 9: Increase dose to 1250mg  q 12 with recent abx failures.  Goal of Therapy:  Vancomycin trough level 10-15 mcg/ml  Plan:  Increase Vancomycin to 1250 mg IV q12h on 6/25  Anna Carlson, PharmD, BCPS Clinical Staff Pharmacist Pager [phone]  Anna Carlson 12/10/2017,3:45 PM

## 2017-12-11 LAB — BASIC METABOLIC PANEL
Anion gap: 9 (ref 5–15)
BUN: 9 mg/dL (ref 6–20)
CO2: 25 mmol/L (ref 22–32)
Calcium: 8.6 mg/dL — ABNORMAL LOW (ref 8.9–10.3)
Chloride: 108 mmol/L (ref 98–111)
Creatinine, Ser: 0.63 mg/dL (ref 0.44–1.00)
GFR calc Af Amer: >60 mL/min (ref >60)
GLUCOSE: 77 mg/dL (ref 70–99)
POTASSIUM: 3.4 mmol/L — AB (ref 3.5–5.1)
Sodium: 142 mmol/L (ref 135–145)

## 2017-12-11 LAB — CBC WITH DIFFERENTIAL/PLATELET
ABS IMMATURE GRANULOCYTES: 0 10*3/uL (ref 0.0–0.1)
Basophils Absolute: 0 10*3/uL (ref 0.0–0.1)
Basophils Relative: 1 %
EOS ABS: 0.2 10*3/uL (ref 0.0–0.7)
EOS PCT: 3 %
HCT: 41.4 % (ref 36.0–46.0)
Hemoglobin: 12.6 g/dL (ref 12.0–15.0)
Immature Granulocytes: 0 %
LYMPHS ABS: 1.7 10*3/uL (ref 0.7–4.0)
Lymphocytes Relative: 22 %
MCH: 28.5 pg (ref 26.0–34.0)
MCHC: 30.4 g/dL (ref 30.0–36.0)
MCV: 93.7 fL (ref 78.0–100.0)
MONO ABS: 0.6 10*3/uL (ref 0.1–1.0)
Monocytes Relative: 8 %
Neutro Abs: 5 10*3/uL (ref 1.7–7.7)
Neutrophils Relative %: 66 %
PLATELETS: 205 10*3/uL (ref 150–400)
RBC: 4.42 MIL/uL (ref 3.87–5.11)
RDW: 14.8 % (ref 11.5–15.5)
WBC: 7.6 10*3/uL (ref 4.0–10.5)

## 2017-12-11 LAB — C-REACTIVE PROTEIN: CRP: 1.4 mg/dL — ABNORMAL HIGH (ref <1.0)

## 2017-12-11 LAB — MAGNESIUM: Magnesium: 1.9 mg/dL (ref 1.7–2.4)

## 2017-12-11 MED ORDER — POTASSIUM CHLORIDE CRYS ER 20 MEQ PO TBCR
40.0000 meq | EXTENDED_RELEASE_TABLET | Freq: Once | ORAL | Status: AC
  Administered 2017-12-11: 40 meq via ORAL
  Filled 2017-12-11: qty 2

## 2017-12-11 MED ORDER — ENOXAPARIN SODIUM 40 MG/0.4ML ~~LOC~~ SOLN
40.0000 mg | SUBCUTANEOUS | Status: DC
  Administered 2017-12-11: 40 mg via SUBCUTANEOUS
  Filled 2017-12-11: qty 0.4

## 2017-12-11 NOTE — Progress Notes (Signed)
Physical Therapy Treatment Patient Details Name: Anna Carlson MRN: [id number] DOB: 05-12-1961 Today's Date: 12/11/2017    History of Present Illness 57 year old female with history of hypertension, rheumatoid arthritis and recent admission for left lower extremity cellulitis for which patient was hospitalized from 12/02/2017 until 12/05/2017 and sent home on oral doxycycline presented on 12/07/2017 for worsening left leg redness.    PT Comments    Pt in bed working on puzzles upon entry, agreeable to participate. Pt reports pain in knee remains mostly unchanged, potentially slightly abated at times. Erythema is minimal this date. Pt c/o increased pain peripatellar region when knee is placed in TKE. Pt AMB 13ft with RW, subjectively improved gait speed compared to prior PT session. Pt concerned with increase in pain while weight bearing, but educated on low risk of "causing damage" or furthering any injury. She reports orthopedic consult was negative for intraarticular involvement at this time. Pt progressing slowly, but not aggressively progressed as pain remains largely unchanged. No concerns with safe access of home at DC from PT perspective. Pt will also have adequate caregiver assistance.    Follow Up Recommendations  Home health PT     Equipment Recommendations  Rolling walker with 5" wheels    Recommendations for Other Services       Precautions / Restrictions Precautions Precautions: Other (comment) Precaution Comments: Keep LLE elevated when not walking Restrictions Weight Bearing Restrictions: No    Mobility  Bed Mobility Overal bed mobility: Modified Independent                Transfers Overall transfer level: Modified independent Equipment used: None;Rolling walker (2 wheeled) Transfers: Sit to/from Stand           General transfer comment: fluent technique, full confidence, safe performance.   Ambulation/Gait Ambulation/Gait assistance:  Supervision Gait Distance (Feet): 150 Feet Assistive device: Rolling walker (2 wheeled)   Gait velocity: 0.25m/s  Gait velocity interpretation: <1.31 ft/sec, indicative of household ambulator General Gait Details: three point step-to gait, decreased step length with RLE. Pt reports she feels to be walking faster today and with less Agricultural engineer Rankin (Stroke Patients Only)       Balance Overall balance assessment: Modified Independent                                          Cognition Arousal/Alertness: Awake/alert Behavior During Therapy: WFL for tasks assessed/performed Overall Cognitive Status: Within Functional Limits for tasks assessed                                        Exercises      General Comments        Pertinent Vitals/Pain Pain Assessment: 0-10 Pain Score: 5  Pain Location: Lt anterior knee  Pain Descriptors / Indicators: Aching Pain Intervention(s): Limited activity within patient's tolerance    Home Living                      Prior Function            PT Goals (current goals can now be found in the care plan section) Acute Rehab PT Goals Patient Stated  Goal: heal PT Goal Formulation: With patient Time For Goal Achievement: 12/23/17 Potential to Achieve Goals: Good Progress towards PT goals: Not progressing toward goals - comment(pain in knee remains mostly unchanged)    Frequency    Min 3X/week      PT Plan Current plan remains appropriate    Co-evaluation              AM-PAC PT "6 Clicks" Daily Activity  Outcome Measure  Difficulty turning over in bed (including adjusting bedclothes, sheets and blankets)?: None Difficulty moving from lying on back to sitting on the side of the bed? : A Little Difficulty sitting down on and standing up from a chair with arms (e.g., wheelchair, bedside commode, etc,.)?: A Little Help  needed moving to and from a bed to chair (including a wheelchair)?: None Help needed walking in hospital room?: None Help needed climbing 3-5 steps with a railing? : A Little 6 Click Score: 21    End of Session   Activity Tolerance: Patient tolerated treatment well;Patient limited by pain Patient left: in chair   PT Visit Diagnosis: Other abnormalities of gait and mobility (R26.89);Pain Pain - Right/Left: Left Pain - part of body: Knee     Time: 1610-9604 PT Time Calculation (min) (ACUTE ONLY): 19 min  Charges:  $Therapeutic Activity: 8-22 mins                    G Codes:      1:27 PM, 12-21-2017 Etta Grandchild, PT, DPT Physical Therapist - [place] [phone] (Pager)  [phone] (Office)      Shavon Ashmore C 21-Dec-2017, 1:20 PM

## 2017-12-11 NOTE — Progress Notes (Signed)
PROGRESS NOTE    Anna Carlson  OAC:[id number] DOB: 01/24/1961 DOA: 12/07/2017 PCP: Benito Mccreedy, MD    Brief Narrative:  57 year old female with history of hypertension, rheumatoid arthritis and recent admission for left lower extremity cellulitis for which patient was hospitalized from 12/02/2017 until 12/05/2017 and sent home on oral doxycycline presented on 12/07/2017 for worsening left leg redness.  She was started on IV antibiotics.  Orthopedics was consulted.   Assessment & Plan:   Principal Problem:   Cellulitis of left leg Active Problems:   Rheumatoid arthritis ([place])   Essential hypertension   Hypokalemia   Left leg cellulitis   Recurrent and persistent cellulitis of the left leg, as per the patient slightly improved from admission.  She has received 4 days of broad spectrum IV antibiotics, VANCOMYCIN and rocephin only with slight improvement. Orthopedics consulted, suggested no indication of surgical intervention at this time.  Meanwhile a CT knee with contrast obtained , does not show any underlying abscess or septic arthritis.  Check vancomycin trough levels and dose as per pharmacy.  Pain control and ambulate.     Hypokalemia:  Replaced.    RA:  On low dose prednisone.    Hypertension:  Well controlled.       DVT prophylaxis: lovenox.  Code Status: full code.  Family Communication: none at bedside.  Disposition Plan: possible d/c home tomorrow with home health.    Consultants:   Orthopedics from [facility]    Procedures: CT of the knee is negative for abscess or septic arthritis.    Antimicrobials:vancomycin and rocephin since 6/21   Subjective:  her pain and redness of the left leg is improving.   Objective: Vitals:   12/10/17 1319 12/10/17 2144 12/11/17 0614 12/11/17 1011  BP: 116/78 138/72 119/64 126/83  Pulse: 74 74 71 83  Resp: 19 18 20    Temp: 98 F (36.7 C) 97.9 F (36.6 C) 98.5 F (36.9 C)   TempSrc: Oral Oral  Oral   SpO2: 96% 100% 100%   Weight:      Height:        Intake/Output Summary (Last 24 hours) at 12/11/2017 1254 Last data filed at 12/11/2017 0550 Gross per 24 hour  Intake 380.75 ml  Output -  Net 380.75 ml   Filed Weights   12/07/17 0449  Weight: 74.3 kg (163 lb 14.4 oz)    Examination:  General exam: Appears calm and comfortable  Respiratory system: Clear to auscultation. Respiratory effort normal. Cardiovascular system: S1 & S2 heard, RRR. No JVD, murmurs,  No pedal edema. Gastrointestinal system: Abdomen is nondistended, soft and nontender. No organomegaly or masses felt. Normal bowel sounds heard. Central nervous system: Alert and oriented. No focal neurological deficits. Extremities:  Left leg below the knee erythematous, tender and slightly swollen, as per the pt , it appears to improve.  Skin: see above.  Psychiatry: Judgement and insight appear normal. Mood & affect appropriate.     Data Reviewed: I have personally reviewed following labs and imaging studies  CBC: Recent Labs  Lab 12/05/17 0640 12/07/17 0031 12/08/17 0422 12/09/17 0804 12/11/17 0635  WBC 8.5 8.3 7.4 7.8 7.6  NEUTROABS  --  5.4 4.7 5.4 5.0  HGB 14.1 13.1 12.8 12.8 12.6  HCT 45.2 42.0 40.3 40.8 41.4  MCV 91.5 89.4 89.6 89.7 93.7  PLT 220 218 206 207 010   Basic Metabolic Panel: Recent Labs  Lab 12/07/17 0031 12/08/17 0422 12/09/17 0804 12/10/17 0525 12/11/17 9323  NA 143 141 140 143 142  K 3.4* 3.3* 3.6 3.5 3.4*  CL 109 108 109 105 108  CO2 26 25 22 27 25   GLUCOSE 102* 117* 75 86 77  BUN 11 11 15 12 9   CREATININE 0.65 0.69 0.62 0.67 0.63  CALCIUM 8.9 8.8* 8.6* 8.9 8.6*  MG  --  2.0 1.9 2.0 1.9   GFR: Estimated Creatinine Clearance: 77.7 mL/min (by C-G formula based on SCr of 0.63 mg/dL). Liver Function Tests: Recent Labs  Lab 12/07/17 0031  AST 22  ALT 25  ALKPHOS 60  BILITOT 0.6  PROT 6.5  ALBUMIN 3.8   No results for input(s): LIPASE, AMYLASE in the last 168  hours. No results for input(s): AMMONIA in the last 168 hours. Coagulation Profile: No results for input(s): INR, PROTIME in the last 168 hours. Cardiac Enzymes: No results for input(s): CKTOTAL, CKMB, CKMBINDEX, TROPONINI in the last 168 hours. BNP (last 3 results) No results for input(s): PROBNP in the last 8760 hours. HbA1C: No results for input(s): HGBA1C in the last 72 hours. CBG: No results for input(s): GLUCAP in the last 168 hours. Lipid Profile: No results for input(s): CHOL, HDL, LDLCALC, TRIG, CHOLHDL, LDLDIRECT in the last 72 hours. Thyroid Function Tests: No results for input(s): TSH, T4TOTAL, FREET4, T3FREE, THYROIDAB in the last 72 hours. Anemia Panel: No results for input(s): VITAMINB12, FOLATE, FERRITIN, TIBC, IRON, RETICCTPCT in the last 72 hours. Sepsis Labs: Recent Labs  Lab 12/07/17 0045 12/07/17 0351  LATICACIDVEN 1.22 0.74    Recent Results (from the past 240 hour(s))  Culture, blood (routine x 2)     Status: None   Collection Time: 12/02/17  8:30 PM  Result Value Ref Range Status   Specimen Description BLOOD RIGHT FOREARM  Final   Special Requests   Final    BOTTLES DRAWN AEROBIC AND ANAEROBIC Blood Culture adequate volume   Culture   Final    NO GROWTH 5 DAYS Performed at [facility], [address], [place], [place] [address]    Report Status 12/07/2017 FINAL  Final  Culture, blood (routine x 2)     Status: None   Collection Time: 12/02/17  8:46 PM  Result Value Ref Range Status   Specimen Description BLOOD LEFT HAND  Final   Special Requests   Final    BOTTLES DRAWN AEROBIC AND ANAEROBIC Blood Culture adequate volume   Culture   Final    NO GROWTH 5 DAYS Performed at [facility], [place] [address] [place], [place] [address]    Report Status 12/07/2017 FINAL  Final  Blood culture (routine x 2)     Status: None (Preliminary result)   Collection Time: 12/07/17  3:15 AM  Result Value Ref Range Status   Specimen Description BLOOD  RIGHT HAND  Final   Special Requests   Final    BOTTLES DRAWN AEROBIC ONLY Blood Culture results may not be optimal due to an inadequate volume of blood received in culture bottles   Culture   Final    NO GROWTH 4 DAYS Performed at [facility], [place] [address], [place], [place] [address]    Report Status PENDING  Incomplete  Blood culture (routine x 2)     Status: None (Preliminary result)   Collection Time: 12/07/17  3:25 AM  Result Value Ref Range Status   Specimen Description BLOOD LEFT ARM  Final   Special Requests   Final  BOTTLES DRAWN AEROBIC AND ANAEROBIC Blood Culture results may not be optimal due to an excessive volume of blood received in culture bottles   Culture   Final    NO GROWTH 4 DAYS Performed at [facility] [address] [place], [place] [address]    Report Status PENDING  Incomplete         Radiology Studies: Ct Knee Left W Contrast  Result Date: 12/10/2017 CLINICAL DATA:  Redness and tenderness and warmth of the left knee 3 weeks. EXAM: CT OF THE LEFT KNEE WITH CONTRAST TECHNIQUE: Multidetector CT imaging was performed following the standard protocol during bolus administration of intravenous contrast. COMPARISON:  Radiographs dated 12/02/2017 FINDINGS: Bones/Joint/Cartilage There is narrowing of the medial and lateral joint spaces particularly anteriorly with small marginal osteophytes in all 3 compartments. No joint effusion. No appreciable Baker's cyst. Ligaments Suboptimally assessed by CT. Cruciate and collateral ligaments are intact. Muscles and Tendons Normal. Soft tissues Edema in the soft tissues anterior to the distal patella and patellar tendon, nonspecific. No definable prepatellar bursitis. IMPRESSION: 1. Nonspecific subcutaneous edema anterior to the patella and patellar tendon. This could represent cellulitis. 2. Tricompartmental arthritic changes. No acute bone abnormality. No joint effusion or Baker's cyst. Electronically Signed    By: Lorriane Shire M.D.   On: 12/10/2017 13:56        Scheduled Meds: . B-complex with vitamin C  1 tablet Oral Daily  . cholecalciferol  1,000 Units Oral Daily  . dicyclomine  20 mg Oral TID AC & HS  . gabapentin  300 mg Oral QHS  . iohexol  100 mL Intravenous Once  . metoprolol tartrate  25 mg Oral BID  . pantoprazole  40 mg Oral Daily  . predniSONE  5 mg Oral Q breakfast  . rosuvastatin  20 mg Oral QPM  . sodium chloride flush  3 mL Intravenous Q12H  . cyanocobalamin  100 mcg Oral Daily   Continuous Infusions: . sodium chloride 250 mL (12/11/17 0446)  . cefTRIAXone (ROCEPHIN)  IV 2 g (12/11/17 1015)  . vancomycin 1,250 mg (12/11/17 0448)     LOS: 3 days    Time spent: 35 minutes.     Hosie Poisson, MD [facility] Pager [phone]  If 7PM-7AM, please contact night-coverage [email or web address] Password [facility] 12/11/2017, 12:54 PM

## 2017-12-12 DIAGNOSIS — Z87891 Personal history of nicotine dependence: Secondary | ICD-10-CM

## 2017-12-12 DIAGNOSIS — L03116 Cellulitis of left lower limb: Principal | ICD-10-CM

## 2017-12-12 DIAGNOSIS — Z888 Allergy status to other drugs, medicaments and biological substances status: Secondary | ICD-10-CM

## 2017-12-12 DIAGNOSIS — Z8261 Family history of arthritis: Secondary | ICD-10-CM

## 2017-12-12 DIAGNOSIS — M069 Rheumatoid arthritis, unspecified: Secondary | ICD-10-CM

## 2017-12-12 DIAGNOSIS — Z882 Allergy status to sulfonamides status: Secondary | ICD-10-CM

## 2017-12-12 DIAGNOSIS — I10 Essential (primary) hypertension: Secondary | ICD-10-CM

## 2017-12-12 DIAGNOSIS — Z7952 Long term (current) use of systemic steroids: Secondary | ICD-10-CM

## 2017-12-12 LAB — CULTURE, BLOOD (ROUTINE X 2)
Culture: NO GROWTH
Culture: NO GROWTH

## 2017-12-12 MED ORDER — IBUPROFEN 800 MG PO TABS: 800.0000 mg | ORAL_TABLET | Freq: Three times a day (TID) | ORAL | 5 refills | Status: AC

## 2017-12-12 MED ORDER — PREDNISONE 5 MG PO TABS: 20.0000 mg | ORAL_TABLET | Freq: Every day | ORAL | 1 refills | Status: AC

## 2017-12-12 MED ORDER — FUROSEMIDE 20 MG PO TABS: 20.0000 mg | ORAL_TABLET | Freq: Every day | ORAL | 0 refills | Status: AC

## 2017-12-12 NOTE — Consult Note (Signed)
[facility]  Total days of antibiotics 10               Reason for Consult: cellulitis    Referring Physician: abrol  Principal Problem:   Cellulitis of left leg Active Problems:   Rheumatoid arthritis ([place])   Essential hypertension   Hypokalemia   Left leg cellulitis    HPI: Anna Carlson is a 57 y.o. female with history of rheumatoid arthritis on chronic prednisone and immune modulator who report having new onset cellulitis to left knee that originally started with pain red nodules to her patella that spreading erythema down to anterior tibia region with associated swelling of leg. She states that she has had several courses of oral abtx, and recently hospitalized for recurrence of this cellulitis. She initially received a course of cephalexin that appeared to work but when she finished the course the rash reappeared. She was given a few days of doxycycline which did not appear to work she states. She has been hospitalized for the last 10d with minimal improvement. She denies fevers, chills, nightsweats. Interestingly pain red nodules are also appearing on her right leg. She has some redness associated at her previous piv sites. Primary team asking to see if ID can weigh in on further abtx.  Past Medical History:  Diagnosis Date  . Angina   . Arthritis   . Bilateral leg pain   . Hypertension   . PVD (peripheral vascular disease) ([facility]) 11/28/2013    Allergies:  Allergies  Allergen Reactions  . Sulfa Antibiotics Swelling  . Leflunomide Rash    Mouth ulcers  Rash at injection site    MEDICATIONS: reviewed   Social History   Tobacco Use  . Smoking status: Former Research scientist (life sciences)  . Smokeless tobacco: Never Used  . Tobacco comment: smoked as teen, quit years ago  Substance Use Topics  . Alcohol use: No    Alcohol/week: 0.0 oz    Comment: socially  . Drug use: No    Family History  Problem Relation Age of Onset  . Diabetes Mother   .  Hypertension Mother   . Arthritis Mother   . Cataracts Mother   . Hyperlipidemia Father   . Diabetes Father   . Heart attack Father   . Diabetes type I Daughter     Review of Systems  Constitutional: Negative for fever, chills, diaphoresis, activity change, appetite change, fatigue and unexpected weight change.  HENT: Negative for congestion, sore throat, rhinorrhea, sneezing, trouble swallowing and sinus pressure.  Eyes: Negative for photophobia and visual disturbance.  Respiratory: Negative for cough, chest tightness, shortness of breath, wheezing and stridor.  Cardiovascular: Negative for chest pain, palpitations and leg swelling.  Gastrointestinal: Negative for nausea, vomiting, abdominal pain, diarrhea, constipation, blood in stool, abdominal distention and anal bleeding.  Genitourinary: Negative for dysuria, hematuria, flank pain and difficulty urinating.  Musculoskeletal: Negative for myalgias, back pain, joint swelling, arthralgias and gait problem.  Skin: Negative for color change, pallor, rash and wound.  Neurological: Negative for dizziness, tremors, weakness and light-headedness.  Hematological: Negative for adenopathy. Does not bruise/bleed easily.  Psychiatric/Behavioral: Negative for behavioral problems, confusion, sleep disturbance, dysphoric mood, decreased concentration and agitation.     OBJECTIVE: Temp:  [98.4 F (36.9 C)-98.7 F (37.1 C)] 98.7 F (37.1 C) (06/26 0447) Pulse Rate:  [58-77] 75 (06/26 1022) Resp:  [18] 18 (06/26 0447) BP: (123-126)/(68-83) 126/83 (06/26 1022) SpO2:  [96 %-99 %] 98 % (06/26 0447)  Physical Exam  Constitutional:  oriented to person, place, and time. appears well-developed and well-nourished. No distress.  HENT: [place]/AT, PERRLA, no scleral icterus Mouth/Throat: Oropharynx is clear and moist. No oropharyngeal exudate.  Cardiovascular: Normal rate, regular rhythm and normal heart sounds. Exam reveals no gallop and no friction rub.  No  murmur heard.  Pulmonary/Chest: Effort normal and breath sounds normal. No respiratory distress.  has no wheezes.  Neck = supple, no nuchal rigidity Abdominal: Soft. Bowel sounds are normal.  exhibits no distension. There is no tenderness.  Lymphadenopathy: no cervical adenopathy. No axillary adenopathy Neurological: alert and oriented to person, place, and time.  Skin =. Erythema to right forearm at previous piv Ext: painful red, warmth exuding nodules to right leg. Left knee also has tender nodules but appears to also have blanching erythema about the patella. Slight edema to left leg vs right leg . Fingers have mild deformity associated with RA Psychiatric: a normal mood and affect.  behavior is normal.    LABS: Results for orders placed or performed during the hospital encounter of 12/07/17 (from the past 48 hour(s))  CBC with Differential/Platelet     Status: None   Collection Time: 12/11/17  6:35 AM  Result Value Ref Range   WBC 7.6 4.0 - 10.5 K/uL   RBC 4.42 3.87 - 5.11 MIL/uL   Hemoglobin 12.6 12.0 - 15.0 g/dL   HCT 41.4 36.0 - 46.0 %   MCV 93.7 78.0 - 100.0 fL   MCH 28.5 26.0 - 34.0 pg   MCHC 30.4 30.0 - 36.0 g/dL   RDW 14.8 11.5 - 15.5 %   Platelets 205 150 - 400 K/uL   Neutrophils Relative % 66 %   Neutro Abs 5.0 1.7 - 7.7 K/uL   Lymphocytes Relative 22 %   Lymphs Abs 1.7 0.7 - 4.0 K/uL   Monocytes Relative 8 %   Monocytes Absolute 0.6 0.1 - 1.0 K/uL   Eosinophils Relative 3 %   Eosinophils Absolute 0.2 0.0 - 0.7 K/uL   Basophils Relative 1 %   Basophils Absolute 0.0 0.0 - 0.1 K/uL   Immature Granulocytes 0 %   Abs Immature Granulocytes 0.0 0.0 - 0.1 K/uL    Comment: Performed at [facility], [address], [place], [place] [address]  Basic metabolic panel     Status: Abnormal   Collection Time: 12/11/17  6:35 AM  Result Value Ref Range   Sodium 142 135 - 145 mmol/L   Potassium 3.4 (L) 3.5 - 5.1 mmol/L   Chloride 108 98 - 111 mmol/L   CO2 25 22 - 32  mmol/L   Glucose, Bld 77 70 - 99 mg/dL   BUN 9 6 - 20 mg/dL   Creatinine, Ser 0.63 0.44 - 1.00 mg/dL   Calcium 8.6 (L) 8.9 - 10.3 mg/dL   GFR calc non Af Amer >60 >60 mL/min   GFR calc Af Amer >60 >60 mL/min    Comment: (NOTE) The eGFR has been calculated using the CKD EPI equation. This calculation has not been validated in all clinical situations. eGFR's persistently <60 mL/min signify possible Chronic Kidney Disease.    Anion gap 9 5 - 15    Comment: Performed at [facility] [address], [place], [place] [address]  Magnesium     Status: None   Collection Time: 12/11/17  6:35 AM  Result Value Ref Range   Magnesium 1.9 1.7 - 2.4 mg/dL    Comment: Performed  at [facility], [place] [address] [place], [place] [address]  C-reactive protein     Status: Abnormal   Collection Time: 12/11/17  6:35 AM  Result Value Ref Range   CRP 1.4 (H) <1.0 mg/dL    Comment: Performed at [facility] [address], [place], [place] [address]   MICRO: reviewed  Assessment/Plan:  57yo F with RA admitted for left knee cellulitis with new red nodular eruptions to right leg suggestive of non infectious process, more like erythema nodosum  - appears to have received good coverage for strep related cellulitis without significant improvement. I suspect that she does not need further abtx but would probably benefit from prednisone 36m steroid taper plus NSAIDS for presumed EN. Recommend to follow up with her rheumatologist.  - recommend to hold her immune modulator for an additional week  Gabriella Woodhead B. [facility] [phone]

## 2017-12-12 NOTE — Care Management Note (Signed)
Case Management Note  Patient Details  Name: Anna Carlson MRN: [id number] Date of Birth: [birth year]  Subjective/Objective:  Cellulitis of L leg                  Action/Plan: Transition to home with family.  Expected Discharge Date:  12/12/17               Expected Discharge Plan:  Home/Self Care  In-House Referral:     Discharge planning Services  CM Consult  Post Acute Care Choice:    Choice offered to:     DME Arranged:   rolling walker DME Agency:   [facility]  HH Arranged:   pt declined home health PT, PT's recommendation. [place] Agency:    n/a Status of Service:  Completed, signed off  If discussed at [place] of Stay Meetings, dates discussed:    Additional Comments:  Sharin Mons, RN 12/12/2017, 2:34 PM

## 2017-12-12 NOTE — Discharge Summary (Addendum)
Physician Discharge Summary  HAJER DWYER MRN: [id number] DOB/AGE: 1960-11-04 57 y.o.  PCP: Benito Mccreedy, MD   Admit date: 12/07/2017 Discharge date: 12/12/2017  Discharge Diagnoses:    Principal Problem:     Rheumatoid arthritis [facility])   Essential hypertension   Hypokalemia  Erythema nodosum    Follow-up recommendations Follow-up with PCP in 3-5 days , including all  additional recommended appointments as below Follow-up CBC, CMP in 3-5 days No further antibiotics to be given as patient likely has erythema nodosum Patient recommended to follow-up with her rheumatologist for erythema nodosum     Allergies as of 12/12/2017      Reactions   Sulfa Antibiotics Swelling   Leflunomide Rash   Mouth ulcers  Rash at injection site      Medication List    STOP taking these medications   doxycycline 100 MG tablet Commonly known as:  VIBRA-TABS     TAKE these medications   acetaminophen 325 MG tablet Commonly known as:  TYLENOL Take 2 tablets (650 mg total) by mouth every 6 (six) hours as needed for fever or headache.   aspirin EC 81 MG tablet Take 1 tablet (81 mg total) by mouth daily.   b complex vitamins tablet Take 1 tablet by mouth daily.   cholecalciferol 1000 units tablet Commonly known as:  VITAMIN D Take 1,000 Units by mouth daily.   cyanocobalamin 100 MCG tablet Take 100 mcg by mouth daily.   dicyclomine 20 MG tablet Commonly known as:  BENTYL Take 20 mg by mouth every 6 (six) hours.   ferrous sulfate 325 (65 FE) MG tablet Take 325 mg by mouth daily with breakfast.   furosemide 20 MG tablet Commonly known as:  LASIX Take 1 tablet (20 mg total) by mouth daily.   gabapentin 300 MG capsule Commonly known as:  NEURONTIN Take 300 mg by mouth at bedtime.   ibuprofen 800 MG tablet Commonly known as:  ADVIL,MOTRIN Take 1 tablet (800 mg total) by mouth 3 (three) times daily. What changed:    when to take this  reasons to take this    metoprolol tartrate 25 MG tablet Commonly known as:  LOPRESSOR Take 25 mg by mouth 2 (two) times daily.   omeprazole 40 MG capsule Commonly known as:  PRILOSEC Take 40 mg by mouth daily as needed (heartburn).   predniSONE 5 MG tablet Commonly known as:  DELTASONE Take 4 tablets (20 mg total) by mouth daily with breakfast. What changed:    how much to take  when to take this   PROAIR HFA 108 (90 Base) MCG/ACT inhaler Generic drug:  albuterol Inhale 1-2 puffs into the lungs every 6 (six) hours as needed for wheezing or shortness of breath.   rosuvastatin 20 MG tablet Commonly known as:  CRESTOR Take 20 mg by mouth every evening.        Discharge Condition:  Stable   Discharge Instructions Get Medicines reviewed and adjusted: Please take all your medications with you for your next visit with your Primary MD  Please request your Primary MD to go over all hospital tests and procedure/radiological results at the follow up, please ask your Primary MD to get all Hospital records sent to his/her office.  If you experience worsening of your admission symptoms, develop shortness of breath, life threatening emergency, suicidal or homicidal thoughts you must seek medical attention immediately by calling 911 or calling your MD immediately  if symptoms less severe.  You must read  complete instructions/literature along with all the possible adverse reactions/side effects for all the Medicines you take and that have been prescribed to you. Take any new Medicines after you have completely understood and accpet all the possible adverse reactions/side effects.   Do not drive when taking Pain medications.   Do not take more than prescribed Pain, Sleep and Anxiety Medications  Special Instructions: If you have smoked or chewed Tobacco  in the last 2 yrs please stop smoking, stop any regular Alcohol  and or any Recreational drug use.  Wear Seat belts while driving.  Please note  You  were cared for by a hospitalist during your hospital stay. Once you are discharged, your primary care physician will handle any further medical issues. Please note that NO REFILLS for any discharge medications will be authorized once you are discharged, as it is imperative that you return to your primary care physician (or establish a relationship with a primary care physician if you do not have one) for your aftercare needs so that they can reassess your need for medications and monitor your lab values.     Allergies  Allergen Reactions  . Sulfa Antibiotics Swelling  . Leflunomide Rash    Mouth ulcers  Rash at injection site      Disposition: Discharge disposition: 01-Home or Self Care        Consults:  Infectious disease    Significant Diagnostic Studies:  Ct Knee Left W Contrast  Result Date: 12/10/2017 CLINICAL DATA:  Redness and tenderness and warmth of the left knee 3 weeks. EXAM: CT OF THE LEFT KNEE WITH CONTRAST TECHNIQUE: Multidetector CT imaging was performed following the standard protocol during bolus administration of intravenous contrast. COMPARISON:  Radiographs dated 12/02/2017 FINDINGS: Bones/Joint/Cartilage There is narrowing of the medial and lateral joint spaces particularly anteriorly with small marginal osteophytes in all 3 compartments. No joint effusion. No appreciable Baker's cyst. Ligaments Suboptimally assessed by CT. Cruciate and collateral ligaments are intact. Muscles and Tendons Normal. Soft tissues Edema in the soft tissues anterior to the distal patella and patellar tendon, nonspecific. No definable prepatellar bursitis. IMPRESSION: 1. Nonspecific subcutaneous edema anterior to the patella and patellar tendon. This could represent cellulitis. 2. Tricompartmental arthritic changes. No acute bone abnormality. No joint effusion or Baker's cyst. Electronically Signed   By: Lorriane Shire M.D.   On: 12/10/2017 13:56   Dg Knee Complete 4 Views Left  Result  Date: 12/02/2017 CLINICAL DATA:  Pain for couple of weeks, no known injury. Redness and swelling. EXAM: LEFT KNEE - COMPLETE 4+ VIEW COMPARISON:  Plain film of the LEFT knee dated 05/11/2008. FINDINGS: Mild tricompartmental degenerative joint space narrowing, similar to previous plain film of 05/11/2008. No acute or suspicious osseous lesion. No fracture line or displaced fracture fragment. No appreciable joint effusion and adjacent soft tissues are unremarkable. IMPRESSION: 1. No acute findings. 2. Mild tricompartmental degenerative joint space narrowing, similar to previous study of 05/11/2008. No large osteophytes or other secondary signs of advanced DJD. Electronically Signed   By: Franki Cabot M.D.   On: 12/02/2017 19:47        Filed Weights   12/07/17 0449  Weight: 74.3 kg (163 lb 14.4 oz)     Microbiology: Recent Results (from the past 240 hour(s))  Culture, blood (routine x 2)     Status: None   Collection Time: 12/02/17  8:30 PM  Result Value Ref Range Status   Specimen Description BLOOD RIGHT FOREARM  Final   Special  Requests   Final    BOTTLES DRAWN AEROBIC AND ANAEROBIC Blood Culture adequate volume   Culture   Final    NO GROWTH 5 DAYS Performed at [facility], [place] [address], [place], [place] [address]    Report Status 12/07/2017 FINAL  Final  Culture, blood (routine x 2)     Status: None   Collection Time: 12/02/17  8:46 PM  Result Value Ref Range Status   Specimen Description BLOOD LEFT HAND  Final   Special Requests   Final    BOTTLES DRAWN AEROBIC AND ANAEROBIC Blood Culture adequate volume   Culture   Final    NO GROWTH 5 DAYS Performed at [facility], [place] [address], [place], [place] [address]    Report Status 12/07/2017 FINAL  Final  Blood culture (routine x 2)     Status: None (Preliminary result)   Collection Time: 12/07/17  3:15 AM  Result Value Ref Range Status   Specimen Description BLOOD RIGHT HAND  Final   Special Requests   Final     BOTTLES DRAWN AEROBIC ONLY Blood Culture results may not be optimal due to an inadequate volume of blood received in culture bottles   Culture   Final    NO GROWTH 4 DAYS Performed at [facility], [place] [address], [place], [place] [address]    Report Status PENDING  Incomplete  Blood culture (routine x 2)     Status: None (Preliminary result)   Collection Time: 12/07/17  3:25 AM  Result Value Ref Range Status   Specimen Description BLOOD LEFT ARM  Final   Special Requests   Final    BOTTLES DRAWN AEROBIC AND ANAEROBIC Blood Culture results may not be optimal due to an excessive volume of blood received in culture bottles   Culture   Final    NO GROWTH 4 DAYS Performed at [facility], [place] [address] [place], [place] [address]    Report Status PENDING  Incomplete       Blood Culture    Component Value Date/Time   SDES BLOOD LEFT ARM 12/07/2017 0325   SPECREQUEST  12/07/2017 0325    BOTTLES DRAWN AEROBIC AND ANAEROBIC Blood Culture results may not be optimal due to an excessive volume of blood received in culture bottles   CULT  12/07/2017 0325    NO GROWTH 4 DAYS Performed at [facility], [place] [address] [place], [place] [address]    REPTSTATUS PENDING 12/07/2017 1655      Labs: Results for orders placed or performed during the hospital encounter of 12/07/17 (from the past 48 hour(s))  Vancomycin, trough     Status: Abnormal   Collection Time: 12/10/17  2:29 PM  Result Value Ref Range   Vancomycin Tr 9 (L) 15 - 20 ug/mL    Comment: Performed at [facility] [address] [place], [place] [address]  CBC with Differential/Platelet     Status: None   Collection Time: 12/11/17  6:35 AM  Result Value Ref Range   WBC 7.6 4.0 - 10.5 K/uL   RBC 4.42 3.87 - 5.11 MIL/uL   Hemoglobin 12.6 12.0 - 15.0 g/dL   HCT 41.4 36.0 - 46.0 %   MCV 93.7 78.0 - 100.0 fL   MCH 28.5 26.0 - 34.0 pg   MCHC 30.4 30.0 - 36.0 g/dL   RDW 14.8 11.5 - 15.5 %   Platelets  205 150 -  400 K/uL   Neutrophils Relative % 66 %   Neutro Abs 5.0 1.7 - 7.7 K/uL   Lymphocytes Relative 22 %   Lymphs Abs 1.7 0.7 - 4.0 K/uL   Monocytes Relative 8 %   Monocytes Absolute 0.6 0.1 - 1.0 K/uL   Eosinophils Relative 3 %   Eosinophils Absolute 0.2 0.0 - 0.7 K/uL   Basophils Relative 1 %   Basophils Absolute 0.0 0.0 - 0.1 K/uL   Immature Granulocytes 0 %   Abs Immature Granulocytes 0.0 0.0 - 0.1 K/uL    Comment: Performed at [facility] [address] [place], [place] [address]  Basic metabolic panel     Status: Abnormal   Collection Time: 12/11/17  6:35 AM  Result Value Ref Range   Sodium 142 135 - 145 mmol/L   Potassium 3.4 (L) 3.5 - 5.1 mmol/L   Chloride 108 98 - 111 mmol/L   CO2 25 22 - 32 mmol/L   Glucose, Bld 77 70 - 99 mg/dL   BUN 9 6 - 20 mg/dL   Creatinine, Ser 0.63 0.44 - 1.00 mg/dL   Calcium 8.6 (L) 8.9 - 10.3 mg/dL   GFR calc non Af Amer >60 >60 mL/min   GFR calc Af Amer >60 >60 mL/min    Comment: (NOTE) The eGFR has been calculated using the CKD EPI equation. This calculation has not been validated in all clinical situations. eGFR's persistently <60 mL/min signify possible Chronic Kidney Disease.    Anion gap 9 5 - 15    Comment: Performed at [facility] [address], [place], [place] [address]  Magnesium     Status: None   Collection Time: 12/11/17  6:35 AM  Result Value Ref Range   Magnesium 1.9 1.7 - 2.4 mg/dL    Comment: Performed at [facility] [address], [place], [place] [address]  C-reactive protein     Status: Abnormal   Collection Time: 12/11/17  6:35 AM  Result Value Ref Range   CRP 1.4 (H) <1.0 mg/dL    Comment: Performed at [facility] [address], [place], [place] [address]     Lipid Panel     Component Value Date/Time   CHOL 181 11/13/2012 1723   TRIG 154 (H) 11/13/2012 1723   HDL 57 11/13/2012 1723   CHOLHDL 3.2 11/13/2012 1723   VLDL 31 11/13/2012 1723   LDLCALC 93 11/13/2012  1723     Lab Results  Component Value Date   HGBA1C 5.5 09/17/2011     Lab Results  Component Value Date   LDLCALC 93 11/13/2012   CREATININE 0.63 12/11/2017     HPI :   Anna Carlson is a 57 y.o. female with medical history significant for hypertension, rheumatoid arthritis, and recent admission for left lower extremity cellulitis, now returning to the emergency department with a expanding area of erythema involving the left lower leg despite continuing antibiotics as an outpatient.  Patient developed erythema involving the left lower leg anteriorly approximately 3 weeks ago, was started on Keflex after several days, failed to improve after a one-week course, and was continued on Keflex for a second week.  She reported continued progression of erythema despite the prolonged course of Keflex and was admitted to the hospital from 12/02/2017 until 12/05/2017, treated with IV doxycycline, evaluated by orthopedic surgery, improved without any surgical intervention, and was discharged home to complete a course of oral doxycycline.  She  has not experienced any fevers or chills, but reports that the erythema has expanded beyond the outlines drawn during the recent admission.  There was not any fall or trauma prior to this illness and she had never experienced similar symptoms previously.  ED Course: Upon arrival to the ED, patient is found to be afebrile, saturating well on room air, and with vitals otherwise normal.  Chemistry panel features a slight hypokalemia and CBC is unremarkable.  Urinalysis is unremarkable and lactic acid is reassuringly normal.  Blood cultures were collected and the patient was started on vancomycin in the ED.  She remains hemodynamically stable, in no apparent respiratory distress, and will be observed on the medical-surgical unit for ongoing evaluation and management.   HOSPITAL COURSE:    Erythema nodosum Patient treated for several weeks as outpatient with  antibiotics, then subsequently on IV antibiotics this admission for  left leg erythema around the knee,    she has had poor response to outpatient antibiotics patient admitted and treated for cellulitis On my physical exam, erythema nodosum was suspected and confirmed by infectious disease, Dr. Graylon Good Patient is being discharged home on ibuprofen and prednisone All antibiotics discontinued Orthopedics consulted, suggested no indication of surgical intervention at this time.  Meanwhile a CT knee with contrast obtained , does not show any underlying abscess or septic arthritis.  Patient is being discharged on ibuprofen 800 mg 3 times a day as well as prednisone 20 mg a day Patient has been advised to follow-up with rheumatology and subsequently dermatology    Hypokalemia:  Replaced.    RA:  On low dose prednisone.    Hypertension:  Well controlled.     Discharge Exam:   Blood pressure 126/83, pulse 75, temperature 98.7 F (37.1 C), temperature source Oral, resp. rate 18, height _0  (1.6 m), weight 74.3 kg (163 lb 14.4 oz), last menstrual period 10/24/2012, SpO2 98 %.  General exam: Appears calm and comfortable  Respiratory system: Clear to auscultation. Respiratory effort normal. Cardiovascular system: S1 & S2 heard, RRR. No JVD, murmurs,  No pedal edema. Gastrointestinal system: Abdomen is nondistended, soft and nontender. No organomegaly or masses felt. Normal bowel sounds heard. Central nervous system: Alert and oriented. No focal neurological deficits. Extremities:  Left leg below the knee erythematous, tender and slightly swollen, as per the pt , it appears to improve.  Skin: see above.  Psychiatry: Judgement and insight appear normal. Mood & affect appropriate      Follow-up Information    Benito Mccreedy, MD. Call.   Specialty:  Internal Medicine Why:  3-5 days Contact information: [address] [place] [address] [phone]            Signed: Reyne Dumas 12/12/2017, 12:28 PM

## 2017-12-12 NOTE — Progress Notes (Signed)
[name] to be D/C'd to home per MD order.  Discussed with the patient and all questions fully answered.  VSS, Skin clean, dry and intact without evidence of skin break down, no evidence of skin tears noted. IV catheter discontinued intact. Site without signs and symptoms of complications. Dressing and pressure applied.  An After Visit Summary was printed and given to the patient. Patient prescriptions sent to pharamacy.  D/c education completed with patient/family including follow up instructions, medication list, d/c activities limitations if indicated, with other d/c instructions as indicated by MD - patient able to verbalize understanding, all questions fully answered.   Patient instructed to return to ED, call 911, or call MD for any changes in condition.   Patient escorted via [place], and D/C home via private auto.  Morley Kos Price 12/12/2017 2:01 PM

## 2019-01-07 IMAGING — DX DG KNEE COMPLETE 4+V*L*
4 series · 4 of 4 positions shown · non-contrast
Comparison: Plain film of the LEFT knee dated 05/11/2008.

CLINICAL DATA: Pain for couple of weeks, no known injury. Redness
and swelling.

EXAM:
LEFT KNEE - COMPLETE 4+ VIEW

[t knee ap left]
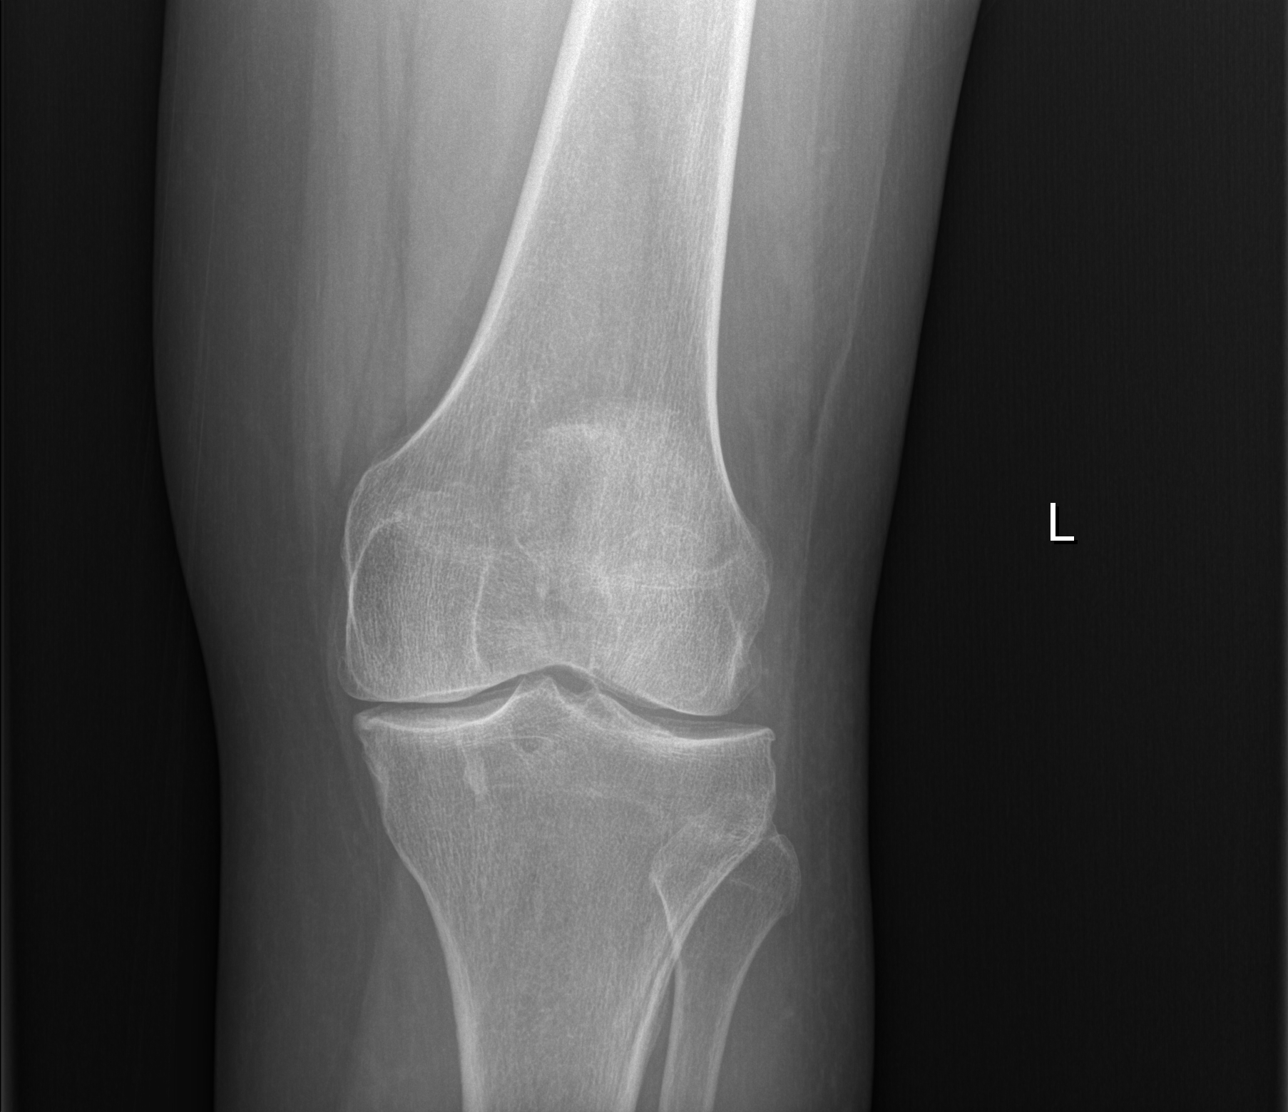

[t knee obl left (1 of 2)]
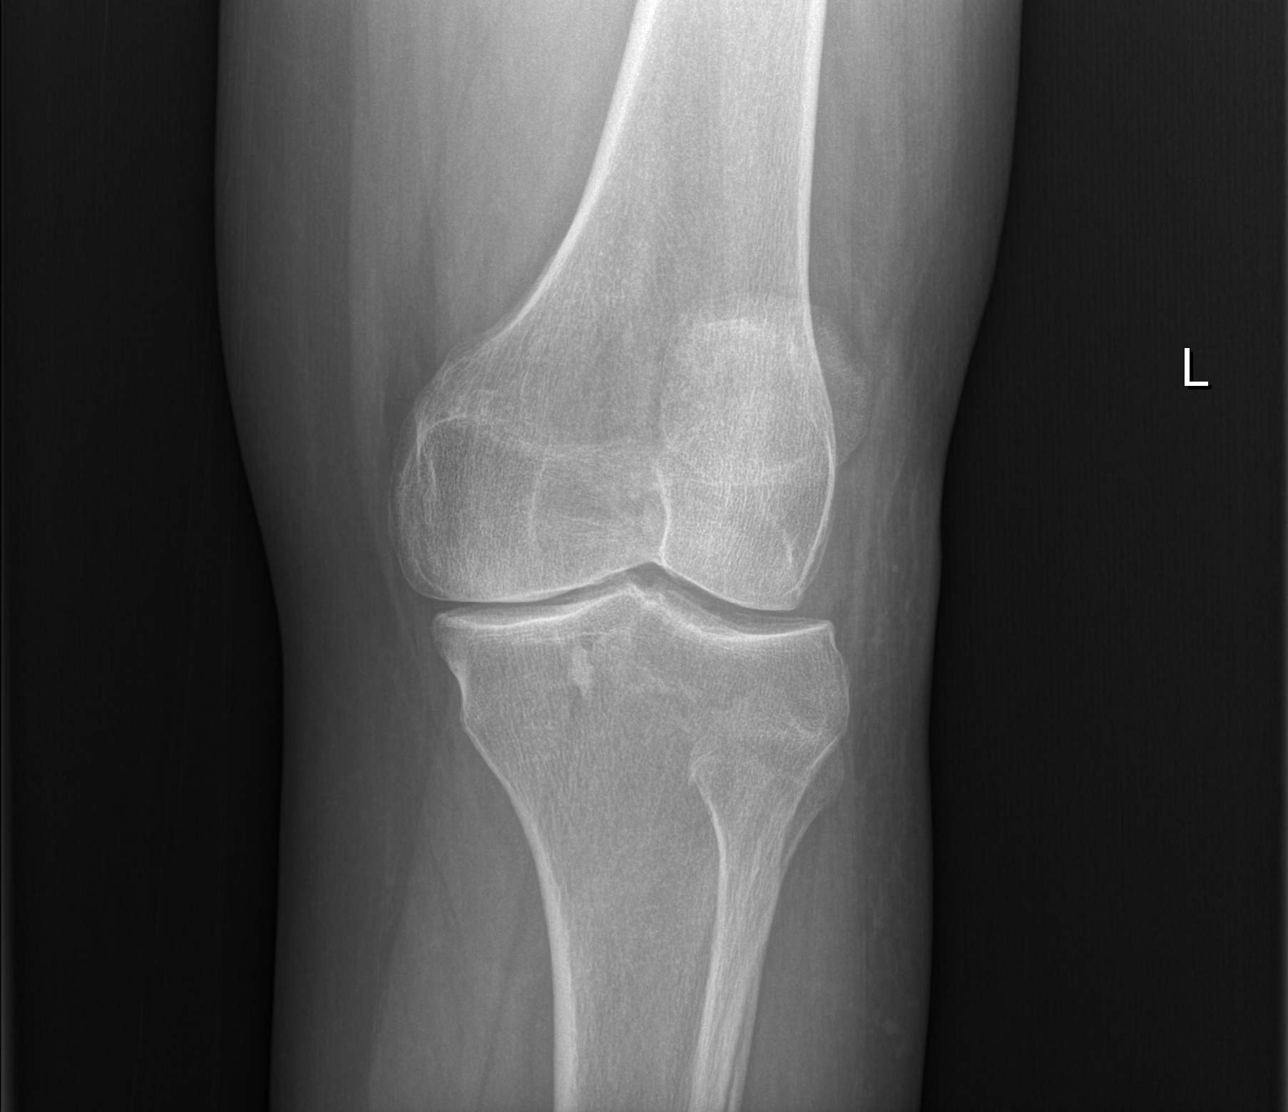

[t knee obl left (2 of 2)]
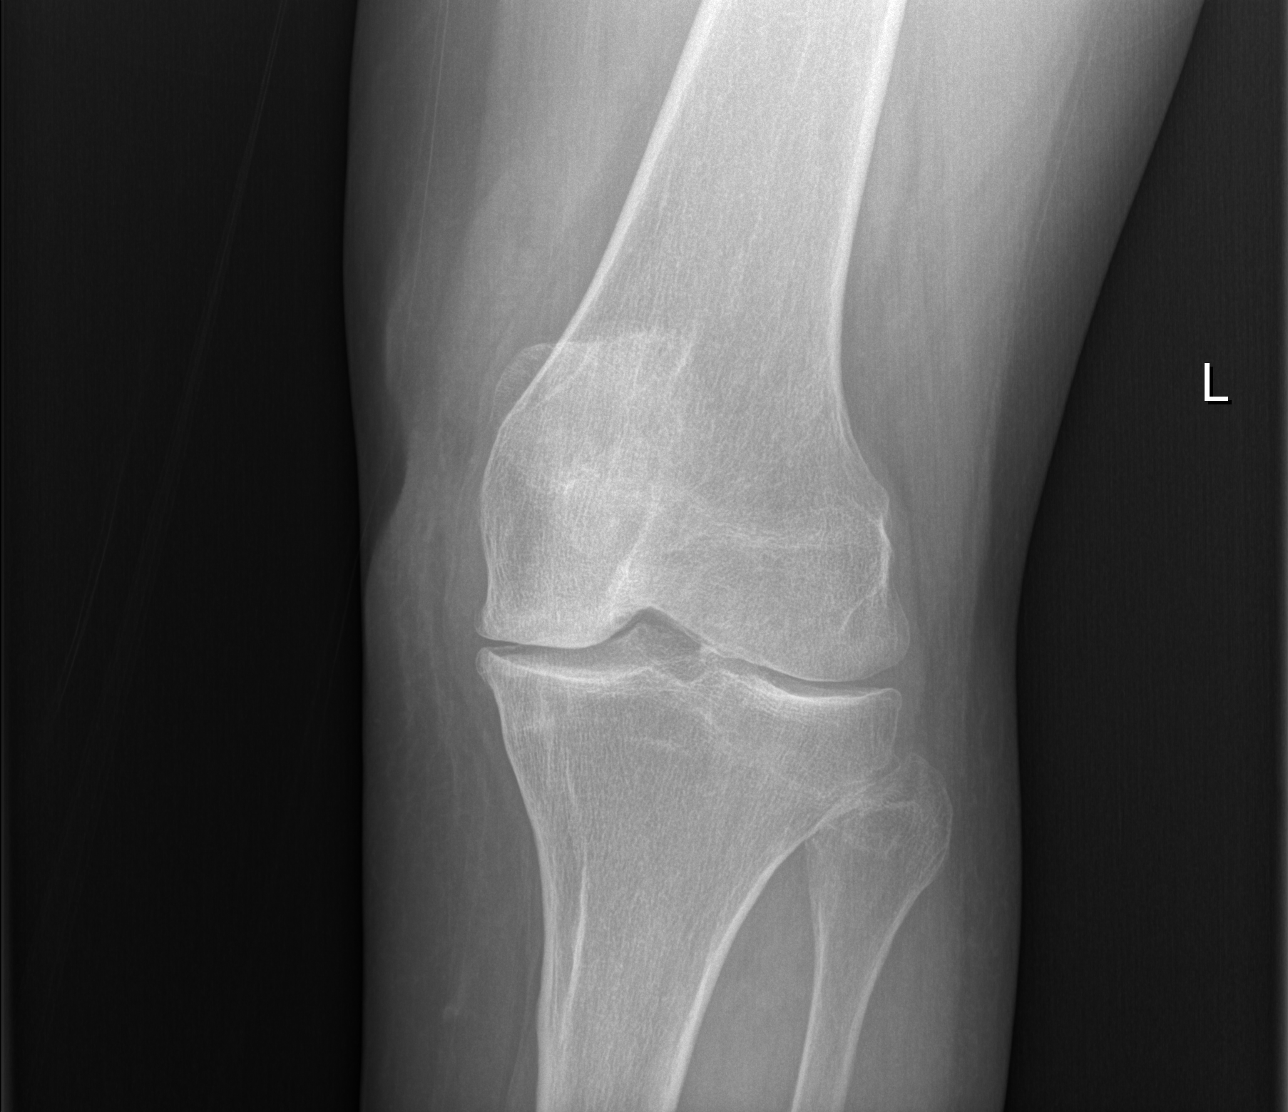

[t knee lat left]
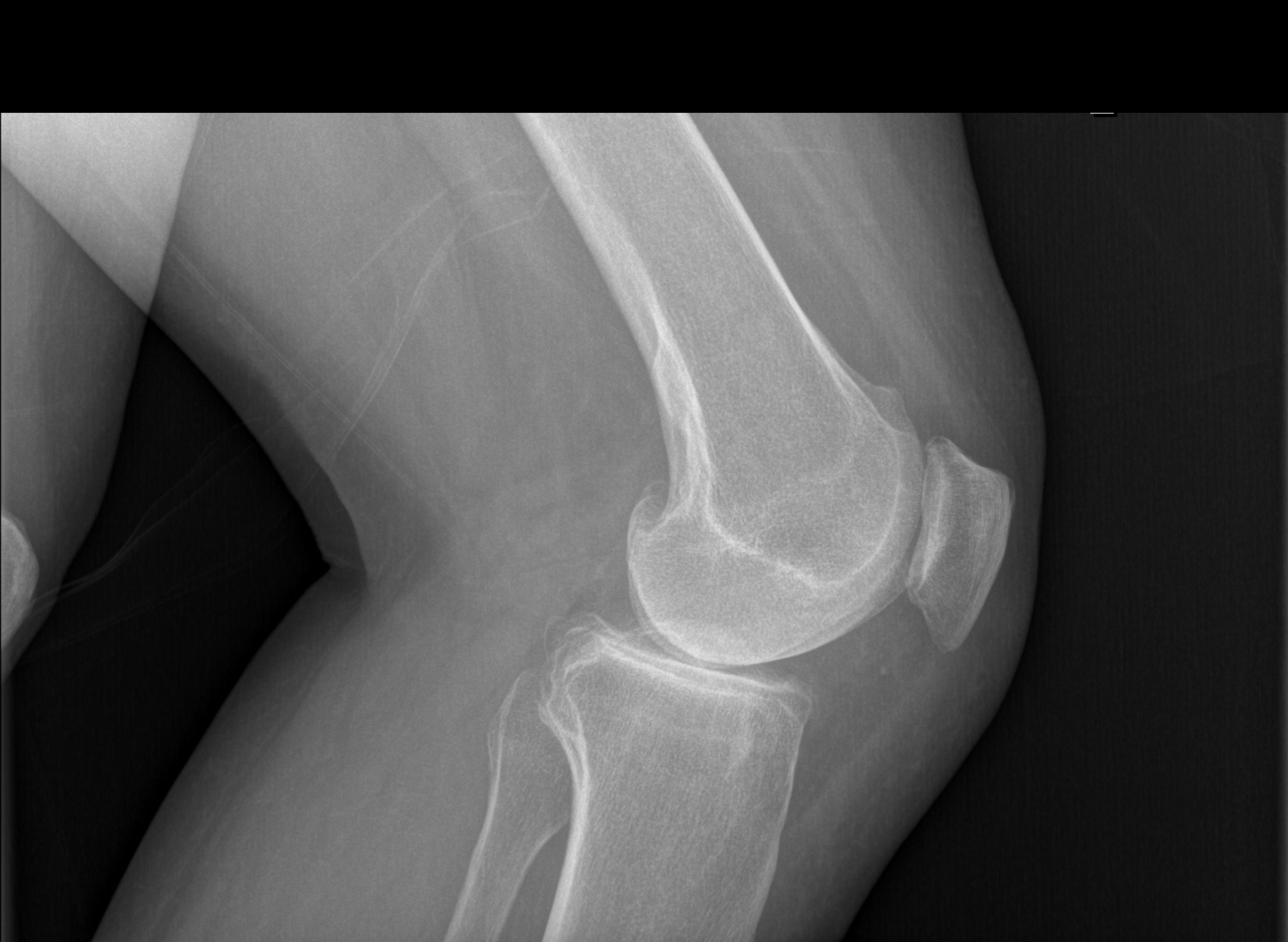

[4 of 4 positions shown; findings below may reference images not displayed]

FINDINGS: Mild tricompartmental degenerative joint space narrowing, similar to
previous plain film of 05/11/2008. No acute or suspicious osseous
lesion. No fracture line or displaced fracture fragment. No
appreciable joint effusion and adjacent soft tissues are
unremarkable.
IMPRESSION: 1. No acute findings.
2. Mild tricompartmental degenerative joint space narrowing, similar
to previous study of 05/11/2008. No large osteophytes or other
secondary signs of advanced DJD.

## 2022-04-12 ENCOUNTER — Ambulatory Visit (INDEPENDENT_AMBULATORY_CARE_PROVIDER_SITE_OTHER): Payer: Medicaid Other | Admitting: Podiatry

## 2022-04-12 DIAGNOSIS — M216X2 Other acquired deformities of left foot: Secondary | ICD-10-CM | POA: Diagnosis not present

## 2022-04-12 DIAGNOSIS — M069 Rheumatoid arthritis, unspecified: Secondary | ICD-10-CM

## 2022-04-12 DIAGNOSIS — M25372 Other instability, left ankle: Secondary | ICD-10-CM

## 2022-04-12 NOTE — Progress Notes (Signed)
  Subjective:  Patient ID: Anna Carlson, female    DOB: May 24, 1961,  MRN: [id number]  Chief Complaint  Patient presents with   Foot Pain    np ongoing left ankle pain and swelling - she had xrays at [facility] less than a month agon    61 y.o. female presents with the above complaint. History confirmed with patient.  She says this is a worsening issue that has been bothering her for nearly a year and a half.  She suffered an injury and fall on the ankle about 18 months ago.  She has a history of rheumatoid arthritis and is always had chronic pain in the foot and ankle itself.  She recently fell a week ago and injured her right knee and left ankle again.  The left ankle gave out walking under.  She went to the ER and they took x-rays a so they do not see any fractures.  Objective:  Physical Exam: warm, good capillary refill, no trophic changes or ulcerative lesions, normal DP and PT pulses, normal sensory exam, and pain and edema over the sinus tarsi, some with anterior ankle joint compression and dorsiflexion, there is laxity in the CFL negative anterior drawer test.   Radiographs: I reviewed the radiograph report of the left ankle x-rays from the emergency room at [facility] 04/03/2022 that showed significant arthrosis of the subtalar joint and ankle a  1. Ankle instability, left   2. Rheumatoid arthritis of left ankle, unspecified whether rheumatoid factor present [facility])      Plan:  Patient was evaluated and treated and all questions answered.  I reviewed my clinical exam findings as well as my impression of the notes from the ER, report of the x-rays as well as orthopedic surgeon.  Currently the knee seems to be more pressing issue.  She is not able to complete weightbearing radiographs today on the ankle so we will wait and defer this to the next visit.  She appears to have long-term ankle instability which has been exacerbated recently by a new fall.  Additionally I  think likely there is a contributing factor of osteoarthritis or rheumatoid arthritis that is significant in the subtalar joint and ankle.  Many of these may be difficult to separate and treat simultaneously to a contralateral limb injury.  I recommend she begin immobilization in a cam walker boot for support.  Considering the chronicity and recurrent injury she has had this ankle I do recommend an MRI for possible surgical planning if that is indicated and this has been ordered.  I will see her back in 1 month and we will take new weightbearing radiographs at that time, she is going for a CT scan on the right knee today and will let me know what the treatment plan for her right knee is.  If she is going to begin therapy for the right knee then I would expect she may benefit as well from therapy on the ankle as well.  Return in about 1 month (around 05/13/2022) for follow up on MRI, take new WB foot and ankle xrays left .

## 2022-04-12 NOTE — Patient Instructions (Signed)
Call [facility] to schedule your MRI at the below locations.  Please allow at least 1 business day after your visit to process the referral.  It may take longer depending on approval from insurance.  Please let me know if you have issues or problems scheduling the MRI   [facility] [phone] [address] [place], [place] [address]    

## 2022-05-01 ENCOUNTER — Telehealth: Payer: Self-pay | Admitting: *Deleted

## 2022-05-01 NOTE — Telephone Encounter (Signed)
Patient is asking if she can cancelled MRI and have xray done since she is now able to put pressure on her foot. Please advise.

## 2022-05-01 NOTE — Telephone Encounter (Signed)
Left message for patient of physician's recommendations/instructions

## 2022-05-05 ENCOUNTER — Other Ambulatory Visit: Payer: Medicaid Other

## 2022-05-15 ENCOUNTER — Ambulatory Visit (INDEPENDENT_AMBULATORY_CARE_PROVIDER_SITE_OTHER): Payer: Medicaid Other

## 2022-05-15 ENCOUNTER — Ambulatory Visit (INDEPENDENT_AMBULATORY_CARE_PROVIDER_SITE_OTHER): Payer: Medicaid Other | Admitting: Podiatry

## 2022-05-15 DIAGNOSIS — M25372 Other instability, left ankle: Secondary | ICD-10-CM

## 2022-05-15 DIAGNOSIS — M19072 Primary osteoarthritis, left ankle and foot: Secondary | ICD-10-CM

## 2022-05-15 NOTE — Progress Notes (Signed)
  Subjective:  Patient ID: Anna Carlson, female    DOB: 10-31-1960,  MRN: [id number]  Chief Complaint  Patient presents with   Tendonitis    Ankle instability left, MRI review and new xrays    61 y.o. female presents with the above complaint. History confirmed with patient.  She returns for follow-up she has been able to put weight on it in the boot so far.  She is in a knee brace on the other side and had a knee injection recently which has helped some.  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  Has had some symptomatic improvement.  I do think now reexamining her in a more stable position and with updated weightbearing radiographs the primary issue here is her osteoarthrosis.  This is quite severe and would require significant surgical undertaking if this is necessary, likely would need subtalar fusion and staged total ankle replacement or fusion.  For now would recommend nonoperative treatment corticosteroid injection and a long-term brace such as an [place] brace.  Rx for [place] brace was given to her today to be scheduled at [facility] clinic.  Following sterile prep with Betadine the left subtalar and ankle joint lateral gutter were then injected with 2 mg of dexamethasone phosphate, 10 mg of Kenalog and 1 cc of 0.5% Marcaine plain.  She tolerated as well.  I will see her back on an as-needed basis if it does not improve, discussed with her that 3 to  4 injections could be undertaken yearly  Return if symptoms worsen or fail to improve.

## 2022-05-15 NOTE — Patient Instructions (Signed)
Supportive shoe brands that are helpful: [employer], Rolena Infante, [place], [place]. The [employer] in [place] is a good resource for [employer]

## 2023-08-02 ENCOUNTER — Other Ambulatory Visit: Payer: Self-pay | Admitting: Physician Assistant

## 2023-08-02 DIAGNOSIS — Z1231 Encounter for screening mammogram for malignant neoplasm of breast: Secondary | ICD-10-CM

## 2024-04-29 ENCOUNTER — Other Ambulatory Visit: Payer: Self-pay | Admitting: Physician Assistant

## 2024-04-29 DIAGNOSIS — Z1231 Encounter for screening mammogram for malignant neoplasm of breast: Secondary | ICD-10-CM
